# Patient Record
Sex: Female | Born: 1990 | Race: White | Hispanic: No | Marital: Single | Smoking: Former smoker
Health system: Southern US, Community
[De-identification: ages and names within clinical notes are randomized; demographics above are authoritative.]

## PROBLEM LIST (undated history)

## (undated) ENCOUNTER — Inpatient Hospital Stay: Payer: Self-pay

## (undated) ENCOUNTER — Emergency Department (HOSPITAL_COMMUNITY): Discharge status: Absent without leave | Payer: Self-pay

## (undated) DIAGNOSIS — K5909 Other constipation: Secondary | ICD-10-CM

## (undated) DIAGNOSIS — T7840XA Allergy, unspecified, initial encounter: Secondary | ICD-10-CM

## (undated) DIAGNOSIS — Z87440 Personal history of urinary (tract) infections: Secondary | ICD-10-CM

## (undated) DIAGNOSIS — F419 Anxiety disorder, unspecified: Secondary | ICD-10-CM

## (undated) DIAGNOSIS — Z9189 Other specified personal risk factors, not elsewhere classified: Secondary | ICD-10-CM

## (undated) DIAGNOSIS — R519 Headache, unspecified: Secondary | ICD-10-CM

## (undated) DIAGNOSIS — F32A Depression, unspecified: Secondary | ICD-10-CM

## (undated) DIAGNOSIS — F329 Major depressive disorder, single episode, unspecified: Secondary | ICD-10-CM

## (undated) DIAGNOSIS — R002 Palpitations: Secondary | ICD-10-CM

## (undated) DIAGNOSIS — R51 Headache: Secondary | ICD-10-CM

## (undated) HISTORY — DX: Allergy, unspecified, initial encounter: T78.40XA

## (undated) HISTORY — DX: Headache: R51

## (undated) HISTORY — DX: Major depressive disorder, single episode, unspecified: F32.9

## (undated) HISTORY — DX: Headache, unspecified: R51.9

## (undated) HISTORY — DX: Depression, unspecified: F32.A

## (undated) HISTORY — DX: Anxiety disorder, unspecified: F41.9

## (undated) HISTORY — DX: Personal history of urinary (tract) infections: Z87.440

## (undated) HISTORY — DX: Other specified personal risk factors, not elsewhere classified: Z91.89

---
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
## 2014-02-14 ENCOUNTER — Encounter: Payer: Self-pay | Admitting: Adult Health

## 2014-02-14 ENCOUNTER — Ambulatory Visit (INDEPENDENT_AMBULATORY_CARE_PROVIDER_SITE_OTHER): Payer: Managed Care, Other (non HMO) | Admitting: Adult Health

## 2014-02-14 VITALS — BP 98/68 | HR 72 | Resp 12 | Ht 66.0 in | Wt 132.0 lb

## 2014-02-14 DIAGNOSIS — Z0000 Encounter for general adult medical examination without abnormal findings: Secondary | ICD-10-CM | POA: Insufficient documentation

## 2014-02-14 MED ORDER — NORGESTIM-ETH ESTRAD TRIPHASIC 0.18/0.215/0.25 MG-35 MCG PO TABS: 1.0000 | ORAL_TABLET | Freq: Every day | ORAL | Status: DC

## 2014-02-14 NOTE — Progress Notes (Signed)
Patient ID: Karen Holden, female   DOB: Dec 13, 1990, 23 y.o.   MRN: [id number]    Subjective:    Patient ID: Karen Holden, female    DOB: [birth year], 23 y.o.   MRN: [id number]  HPI  Pt is a pleasant 23 y/o female who presents to clinic to establish care. She has not had a PCP in several years. Overall she is feeling well.     Past Medical History  Diagnosis Date  . Depression   . History of fainting   . Frequent headaches   . History of urinary tract infection      Past Surgical History  Procedure Laterality Date  . Fracture surgery  2011    left arm      Family History  Problem Relation Age of Onset  . Arthritis Mother   . Cancer Mother     Ovarian Cancer   . Alcohol abuse Father   . Stroke Maternal Grandfather   . Heart disease Maternal Grandfather   . Arthritis Maternal Grandfather   . Alcohol abuse Maternal Grandfather   . Cancer Paternal Grandfather     Colon Cancer      History   Social History  . Marital Status: Single    Spouse Name: N/A    Number of Children: N/A  . Years of Education: N/A   Occupational History  . Not on file.   Social History Main Topics  . Smoking status: Never Smoker   . Smokeless tobacco: Not on file  . Alcohol Use: No  . Drug Use: No  . Sexual Activity: Not on file   Other Topics Concern  . Not on file   Social History Narrative  . No narrative on file         Review of Systems  Constitutional: Negative.   HENT: Negative.   Eyes: Negative.   Respiratory: Negative.   Cardiovascular: Negative.   Gastrointestinal: Negative.   Endocrine: Negative.   Genitourinary: Negative.   Musculoskeletal: Negative.   Skin: Negative.   Allergic/Immunologic: Negative.   Neurological: Negative.   Hematological: Negative.   Psychiatric/Behavioral: Negative.        Objective:  Ht 5\' 6"  (1.676 m)  Wt 132 lb (59.875 kg)  BMI 21.32 kg/m2  LMP 02/02/2014   Physical Exam  Constitutional: She is oriented to  person, place, and time. She appears well-developed and well-nourished. No distress.  HENT:  Head: Normocephalic and atraumatic.  Right Ear: External ear normal.  Left Ear: External ear normal.  Nose: Nose normal.  Mouth/Throat: Oropharynx is clear and moist.  Eyes: Conjunctivae and EOM are normal. Pupils are equal, round, and reactive to light.  Neck: Normal range of motion. Neck supple. No tracheal deviation present. No thyromegaly present.  Cardiovascular: Normal rate, regular rhythm, normal heart sounds and intact distal pulses.  Exam reveals no gallop and no friction rub.   No murmur heard. Pulmonary/Chest: Effort normal and breath sounds normal. No respiratory distress. She has no wheezes. She has no rales.  Abdominal: Soft. Bowel sounds are normal. She exhibits no distension and no mass. There is no tenderness. There is no rebound and no guarding.  Musculoskeletal: Normal range of motion. She exhibits no edema and no tenderness.  Lymphadenopathy:    She has no cervical adenopathy.  Neurological: She is alert and oriented to person, place, and time. She has normal reflexes. No cranial nerve deficit. Coordination normal.  Skin: Skin is warm and dry.     Psychiatric: She  has a normal mood and affect. Her behavior is normal. Judgment and thought content normal.       Assessment & Plan:

## 2014-02-14 NOTE — Progress Notes (Signed)
Pre visit review using our clinic review tool, if applicable. No additional management support is needed unless otherwise documented below in the visit note. 

## 2014-02-14 NOTE — Patient Instructions (Signed)
   Thank you for choosing [place] at [facility] for your health care needs.  Please have your labs drawn prior to leaving the office.  The results will be available through MyChart for your convenience. Please remember to activate this. The activation code is located at the end of this form.  Return at your earliest convenience for your fasting labs. You can drink water but nothing else after midnight the night before.

## 2014-02-21 ENCOUNTER — Other Ambulatory Visit (INDEPENDENT_AMBULATORY_CARE_PROVIDER_SITE_OTHER): Payer: Managed Care, Other (non HMO)

## 2014-02-21 DIAGNOSIS — Z0000 Encounter for general adult medical examination without abnormal findings: Secondary | ICD-10-CM

## 2014-02-21 LAB — CBC WITH DIFFERENTIAL/PLATELET
BASOS ABS: 0 10*3/uL (ref 0.0–0.1)
BASOS PCT: 0.5 % (ref 0.0–3.0)
Eosinophils Absolute: 0.1 10*3/uL (ref 0.0–0.7)
Eosinophils Relative: 2.9 % (ref 0.0–5.0)
HEMATOCRIT: 37.8 % (ref 36.0–46.0)
HEMOGLOBIN: 12.9 g/dL (ref 12.0–15.0)
LYMPHS ABS: 2 10*3/uL (ref 0.7–4.0)
Lymphocytes Relative: 44.3 % (ref 12.0–46.0)
MCHC: 34.1 g/dL (ref 30.0–36.0)
MCV: 87.6 fl (ref 78.0–100.0)
MONOS PCT: 11.2 % (ref 3.0–12.0)
Monocytes Absolute: 0.5 10*3/uL (ref 0.1–1.0)
NEUTROS ABS: 1.9 10*3/uL (ref 1.4–7.7)
Neutrophils Relative %: 41.1 % — ABNORMAL LOW (ref 43.0–77.0)
Platelets: 283 10*3/uL (ref 150.0–400.0)
RBC: 4.32 Mil/uL (ref 3.87–5.11)
RDW: 12.2 % (ref 11.5–14.6)
WBC: 4.6 10*3/uL (ref 4.5–10.5)

## 2014-02-21 LAB — COMPREHENSIVE METABOLIC PANEL
ALT: 17 U/L (ref 0–35)
AST: 19 U/L (ref 0–37)
Albumin: 4.4 g/dL (ref 3.5–5.2)
Alkaline Phosphatase: 30 U/L — ABNORMAL LOW (ref 39–117)
BILIRUBIN TOTAL: 0.8 mg/dL (ref 0.3–1.2)
BUN: 7 mg/dL (ref 6–23)
CO2: 26 mEq/L (ref 19–32)
Calcium: 9.4 mg/dL (ref 8.4–10.5)
Chloride: 104 mEq/L (ref 96–112)
Creatinine, Ser: 0.7 mg/dL (ref 0.4–1.2)
GFR: 122.79 mL/min (ref >60.00)
GLUCOSE: 88 mg/dL (ref 70–99)
POTASSIUM: 3.8 meq/L (ref 3.5–5.1)
Sodium: 137 mEq/L (ref 135–145)
Total Protein: 7.2 g/dL (ref 6.0–8.3)

## 2014-02-21 LAB — LIPID PANEL
CHOLESTEROL: 206 mg/dL — AB (ref 0–200)
HDL: 69.1 mg/dL (ref >39.00)
LDL Cholesterol: 122 mg/dL — ABNORMAL HIGH (ref 0–99)
Total CHOL/HDL Ratio: 3
Triglycerides: 75 mg/dL (ref 0.0–149.0)
VLDL: 15 mg/dL (ref 0.0–40.0)

## 2014-02-21 LAB — TSH: TSH: 0.57 u[IU]/mL (ref 0.35–5.50)

## 2014-02-22 ENCOUNTER — Encounter: Payer: Self-pay | Admitting: *Deleted

## 2014-02-22 LAB — VITAMIN D 25 HYDROXY (VIT D DEFICIENCY, FRACTURES): VIT D 25 HYDROXY: 32 ng/mL (ref 30–89)

## 2014-02-28 ENCOUNTER — Encounter: Payer: Self-pay | Admitting: Adult Health

## 2014-03-02 NOTE — Telephone Encounter (Signed)
Unread mychart message mailed  

## 2014-03-24 ENCOUNTER — Ambulatory Visit (INDEPENDENT_AMBULATORY_CARE_PROVIDER_SITE_OTHER): Payer: Managed Care, Other (non HMO) | Admitting: Adult Health

## 2014-03-24 ENCOUNTER — Encounter: Payer: Self-pay | Admitting: Adult Health

## 2014-03-24 VITALS — BP 100/58 | HR 65 | Temp 98.6°F | Resp 14 | Wt 127.0 lb

## 2014-03-24 DIAGNOSIS — R109 Unspecified abdominal pain: Secondary | ICD-10-CM

## 2014-03-24 LAB — POCT URINALYSIS DIPSTICK
BILIRUBIN UA: NEGATIVE
GLUCOSE UA: NEGATIVE
KETONES UA: NEGATIVE
Leukocytes, UA: NEGATIVE
NITRITE UA: NEGATIVE
Protein, UA: NEGATIVE
RBC UA: NEGATIVE
SPEC GRAV UA: 1.02
Urobilinogen, UA: 0.2
pH, UA: 7

## 2014-03-24 NOTE — Progress Notes (Signed)
Patient ID: Karen Holden Karen Holden, female   DOB: 1991/02/22, 23 y.o.   MRN: [id number]   Subjective:    Patient ID: Karen Holden Karen Holden, female    DOB: 10-12-1991, 23 y.o.   MRN: [id number]  HPI  Karen Holden Holden is a pleasant 23 year old female who presents to clinic with right flank pain that radiated around to the front. The pain began on Tuesday. She reports the pain subsided on Wednesday. She is concerned because she has had kidney infections in the past. She is not having any dysuria. Started menstrual cycle on Wednesday. No fever, chills.   Past Medical History  Diagnosis Date  . Depression     Dealing with the death of her best friend  . History of fainting   . Frequent headaches   . History of urinary tract infection     Current Outpatient Prescriptions on File Prior to Visit  Medication Sig Dispense Refill  . Norgestimate-Ethinyl Estradiol Triphasic (ORTHO TRI-CYCLEN, 28,) 0.18/0.215/0.25 MG-35 MCG tablet Take 1 tablet by mouth daily.  1 Package  11   No current facility-administered medications on file prior to visit.     Review of Systems  Constitutional: Negative for fever and chills.  Genitourinary: Positive for flank pain (radiating around to the front). Negative for dysuria, frequency and hematuria (menstruating).  All other systems reviewed and are negative.      Objective:  BP 100/58  Pulse 65  Temp(Src) 98.6 F (37 C) (Oral)  Resp 14  Wt 127 lb (57.607 kg)  SpO2 98%  LMP 03/22/2014   Physical Exam  Constitutional: She is oriented to person, place, and time. No distress.  Cardiovascular: Normal rate and regular rhythm.   Pulmonary/Chest: Effort normal. No respiratory distress.  Abdominal: Soft. She exhibits no distension and no mass. There is no tenderness. There is no rebound and no guarding.  Genitourinary:  Exam is normal. Patient is not exhibiting any costovertebral angle tenderness with palpation. No suprapubic tenderness.  Musculoskeletal: Normal range of  motion.  Neurological: She is alert and oriented to person, place, and time.  Skin: Skin is warm and dry.  Psychiatric: She has a normal mood and affect. Her behavior is normal. Judgment and thought content normal.      Assessment & Plan:   1. Flank pain UA negative for UTI. Her symptoms are currently relieved. She is to return to clinic if symptoms return.  - POCT urinalysis dipstick

## 2014-03-24 NOTE — Progress Notes (Signed)
Pre visit review using our clinic review tool, if applicable. No additional management support is needed unless otherwise documented below in the visit note. 

## 2014-04-26 ENCOUNTER — Encounter: Payer: Self-pay | Admitting: Adult Health

## 2014-04-26 ENCOUNTER — Ambulatory Visit: Payer: Self-pay | Admitting: Adult Health

## 2014-04-26 ENCOUNTER — Ambulatory Visit (INDEPENDENT_AMBULATORY_CARE_PROVIDER_SITE_OTHER): Payer: Managed Care, Other (non HMO) | Admitting: Adult Health

## 2014-04-26 VITALS — BP 104/60 | HR 70 | Temp 98.1°F | Resp 12 | Ht 66.0 in | Wt 124.5 lb

## 2014-04-26 DIAGNOSIS — R1011 Right upper quadrant pain: Secondary | ICD-10-CM

## 2014-04-26 DIAGNOSIS — Z139 Encounter for screening, unspecified: Secondary | ICD-10-CM

## 2014-04-26 LAB — POCT URINE PREGNANCY: Preg Test, Ur: NEGATIVE

## 2014-04-26 NOTE — Progress Notes (Signed)
Patient ID: Karen Holden, female   DOB: Apr 30, 1991, 23 y.o.   MRN: [id number]    Subjective:    Patient ID: Karen Holden, female    DOB: [birth year], 23 y.o.   MRN: [id number]  HPI  Pt is a pleasant 23 y/o female who presents to clinic with RUQ pain with nausea and episodes of vomiting. Denies fever, chills, hematuria. LMP 04/17/14. She is on BCP. She was seen in clinic in April with right flank pain that radiated anteriorly. She denies any flank pain today. Reports hx of "kidney infection". Denies hx of nephrolithiasis. Pt reports that her pain has never really resolved since I last saw her in April. She also reports that she "feels a lump on that side".   Past Medical History  Diagnosis Date  . Depression     Dealing with the death of her best friend  . History of fainting   . Frequent headaches   . History of urinary tract infection     Current Outpatient Prescriptions on File Prior to Visit  Medication Sig Dispense Refill  . Norgestimate-Ethinyl Estradiol Triphasic (ORTHO TRI-CYCLEN, 28,) 0.18/0.215/0.25 MG-35 MCG tablet Take 1 tablet by mouth daily.  1 Package  11   No current facility-administered medications on file prior to visit.     Review of Systems  Constitutional: Negative.   HENT: Negative.   Eyes: Negative.   Respiratory: Negative.   Cardiovascular: Negative.   Gastrointestinal: Positive for nausea, vomiting and abdominal pain. Negative for diarrhea and constipation.  Endocrine: Negative.   Genitourinary: Negative.  Negative for dysuria, frequency, hematuria, flank pain, difficulty urinating and menstrual problem.  Musculoskeletal: Negative.   Skin: Negative.   Allergic/Immunologic: Negative.   Neurological: Negative.   Hematological: Negative.   Psychiatric/Behavioral: Negative.        Objective:  BP 104/60  Pulse 70  Temp(Src) 98.1 F (36.7 C) (Oral)  Resp 12  Wt 124 lb 8 oz (56.473 kg)  SpO2 97%  LMP 04/19/2014   Physical Exam    Constitutional: She is oriented to person, place, and time. No distress.  HENT:  Head: Normocephalic and atraumatic.  Eyes: Conjunctivae and EOM are normal.  Neck: Normal range of motion. Neck supple.  Cardiovascular: Normal rate and regular rhythm.   Pulmonary/Chest: Effort normal. No respiratory distress.  Abdominal: Soft. Bowel sounds are normal. She exhibits no distension and no mass. There is no tenderness. There is no rebound and no guarding.  No palpable masses. No tenderness with deep palpation  Musculoskeletal: Normal range of motion.  Neurological: She is alert and oriented to person, place, and time. She has normal reflexes. Coordination normal.  Skin: Skin is warm and dry.  Psychiatric: She has a normal mood and affect. Her behavior is normal. Judgment and thought content normal.      Assessment & Plan:   1. RUQ abdominal pain Pt presents with pain since April. Last seen in office at that time which she was also c/o R flank pain. None today. Also having n/v. Denies hematuria.  - CT Abdomen Pelvis W Contrast; Future  2. Screening Pregnancy screen prior to CT scan. LMP 04/17/14. Pt on BCP - B-HCG Quant

## 2014-04-26 NOTE — Progress Notes (Signed)
Pre visit review using our clinic review tool, if applicable. No additional management support is needed unless otherwise documented below in the visit note. 

## 2014-04-28 ENCOUNTER — Encounter: Payer: Self-pay | Admitting: Adult Health

## 2014-06-05 ENCOUNTER — Encounter: Payer: Self-pay | Admitting: Adult Health

## 2014-08-31 ENCOUNTER — Ambulatory Visit (INDEPENDENT_AMBULATORY_CARE_PROVIDER_SITE_OTHER): Payer: Managed Care, Other (non HMO) | Admitting: Internal Medicine

## 2014-08-31 ENCOUNTER — Encounter: Payer: Self-pay | Admitting: Internal Medicine

## 2014-08-31 VITALS — BP 102/68 | HR 72 | Temp 98.5°F | Wt 127.0 lb

## 2014-08-31 DIAGNOSIS — Z889 Allergy status to unspecified drugs, medicaments and biological substances status: Secondary | ICD-10-CM

## 2014-08-31 DIAGNOSIS — Z9109 Other allergy status, other than to drugs and biological substances: Secondary | ICD-10-CM

## 2014-08-31 NOTE — Progress Notes (Signed)
Pre visit review using our clinic review tool, if applicable. No additional management support is needed unless otherwise documented below in the visit note. 

## 2014-08-31 NOTE — Patient Instructions (Signed)

## 2014-08-31 NOTE — Progress Notes (Signed)
HPI  Pt presents to the clinic today with c/o sore throat, dizziness, chills and fatigue. She reports this started 4-5 days ago. She denies fever, cough or body aches.She has tried Ibuprofen and Dayquil with little relief. She has no history of allergies or breathing problems. She has had sick contacts (recently babysitted her niece with pneumonia).  Review of Systems      Past Medical History  Diagnosis Date  . Depression     Dealing with the death of her best friend  . History of fainting   . Frequent headaches   . History of urinary tract infection     Family History  Problem Relation Age of Onset  . Arthritis Mother   . Cancer Mother      Cervical Cancer   . Alcohol abuse Father   . Stroke Maternal Grandfather   . Heart disease Maternal Grandfather   . Arthritis Maternal Grandfather   . Alcohol abuse Maternal Grandfather   . Cancer Paternal Grandfather     Colon Cancer     History   Social History  . Marital Status: Single    Spouse Name: N/A    Number of Children: N/A  . 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 Smoking status: Never Smoker   . Smokeless tobacco: Not on file  . Alcohol Use: No  . Drug Use: No  . Sexual Activity: Not on file   Other Topics Concern  . Not on file   Social History Narrative   Karen Holden grew up in [place], [place]. She lives at home with her mom. Karen Holden works in Scientist, research (medical) at [employer]. She enjoys spending time with her friends.    Allergies  Allergen Reactions  . Imitrex [Sumatriptan] Other (See Comments)    Chest pain     Constitutional: Positive fatigue. Denies fever, headache or abrupt weight changes.  HEENT:  Positive sore throat. Denies eye redness, eye pain, pressure behind the eyes, facial pain, nasal congestion, ear pain, ringing in the ears, wax buildup, runny nose or bloody nose. Respiratory: Denies cough or difficulty breathing or shortness of  breath.  Cardiovascular: Denies chest pain, chest tightness, palpitations or swelling in the hands or feet.   No other specific complaints in a complete review of systems (except as listed in HPI above).  Objective:   BP 102/68  Pulse 72  Temp(Src) 98.5 F (36.9 C) (Oral)  Wt 127 lb (57.607 kg)  SpO2 98%  LMP 08/28/2014  Wt Readings from Last 3 Encounters:  08/31/14 127 lb (57.607 kg)  04/26/14 124 lb 8 oz (56.473 kg)  03/24/14 127 lb (57.607 kg)     General: Appears her stated age, well developed, well nourished in NAD. HEENT: ars: Tm's gray and intact, normal light reflex; Nose: mucosa pink and moist, septum midline; Throat/Mouth: + PND. Teeth present, mucosa erythematous and moist, no exudate noted, no lesions or ulcerations noted.  Cardiovascular: Normal rate and rhythm. S1,S2 noted.  No murmur, rubs or gallops noted. No JVD or BLE edema. No carotid bruits noted. Pulmonary/Chest: Normal effort and positive vesicular breath sounds. No respiratory distress. No wheezes, rales or ronchi noted.      Assessment & Plan:   Sore throat secondary to allergies:  Get some rest and drink plenty of water Do salt water gargles for the sore throat Ibuprofen Start zyrtec OTC  RTC as needed or  if symptoms persist.

## 2014-08-31 NOTE — Progress Notes (Signed)
Subjective:    Patient ID: Karen Holden, female    DOB: [birth year], 23 y.o.   MRN: [id number]  HPI  Patient presetnt with sore throat dizzyness fatgue and chills for the past 4 days. She has taken ibuprofen and dayquil with little relief.  She reports that she was around  her niece this past weekend who was Dx with Pneumonia.  She reports that her niece had different symptoms.  She also reports a strong history of seasonal allergies. .Review of Systems   Past Medical History  Diagnosis Date  . Depression     Dealing with the death of her best friend  . History of fainting   . Frequent headaches   . History of urinary tract infection     Current Outpatient Prescriptions  Medication Sig Dispense Refill  . Norgestimate-Ethinyl Estradiol Triphasic (ORTHO TRI-CYCLEN, 28,) 0.18/0.215/0.25 MG-35 MCG tablet Take 1 tablet by mouth daily.  1 Package  11   No current facility-administered medications for this visit.    Allergies  Allergen Reactions  . Imitrex [Sumatriptan] Other (See Comments)    Chest pain    Family History  Problem Relation Age of Onset  . Arthritis Mother   . Cancer Mother      Cervical Cancer   . Alcohol abuse Father   . Stroke Maternal Grandfather   . Heart disease Maternal Grandfather   . Arthritis Maternal Grandfather   . Alcohol abuse Maternal Grandfather   . Cancer Paternal Grandfather     Colon Cancer     History   Social History  . Marital Status: Single    Spouse Name: N/A    Number of Children: N/A  . 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 Smoking status: Never Smoker   . Smokeless tobacco: Not on file  . Alcohol Use: No  . Drug Use: No  . Sexual Activity: Not on file   Other Topics Concern  . Not on file   Social History Narrative   Karen Holden grew up in [place], [place]. She lives at home with her mom. Marketa works in Scientist, research (medical) at [employer]. She enjoys  spending time with her friends.     Constitutional: Denies fever, malaise,or abrupt weight changes.  HEENT: Denies eye pain, eye redness, ear pain, ringing in the ears, wax buildup, runny nose, nasal congestion, bloody nose. Respiratory: Denies difficulty breathing, shortness of breath, cough or sputum production.   Cardiovascular: Denies chest pain, chest tightness, palpitations or swelling in the hands or feet.   No other specific complaints in a complete review of systems (except as listed in HPI above).     Objective:   Physical Exam  BP 102/68  Pulse 72  Temp(Src) 98.5 F (36.9 C) (Oral)  Wt 127 lb (57.607 kg)  SpO2 98%  LMP 08/28/2014 Wt Readings from Last 3 Encounters:  08/31/14 127 lb (57.607 kg)  04/26/14 124 lb 8 oz (56.473 kg)  03/24/14 127 lb (57.607 kg)    General: Appears their stated age, well developed, well nourished in NAD. HEENT: Ears: Tm's gray and intact, normal light reflex; Nose: mucosa pink and moist, septum midline; Throat/Mouth: pharynx erythematous. Neck: . Neck supple, trachea midline. No lymphadenopathy noted.  Cardiovascular: Normal rate and rhythm. S1,S2 noted.  No murmur, rubs or gallops noted.Pulmonary/Chest: Normal effort and positive vesicular breath sounds. No respiratory distress. No wheezes, rales or ronchi  noted.   BMET    Component Value Date/Time   NA 137 02/21/2014 1016   K 3.8 02/21/2014 1016   CL 104 02/21/2014 1016   CO2 26 02/21/2014 1016   GLUCOSE 88 02/21/2014 1016   BUN 7 02/21/2014 1016   CREATININE 0.7 02/21/2014 1016   CALCIUM 9.4 02/21/2014 1016   GFRNONAA NOT CALCULATED 07/31/2010 2030   GFRAA  Value: NOT CALCULATED        The eGFR has been calculated using the MDRD equation. This calculation has not been validated in all clinical situations. eGFR's persistently <60 mL/min signify possible Chronic Kidney Disease. 07/31/2010 2030    Lipid Panel     Component Value Date/Time   CHOL 206* 02/21/2014 1016   TRIG 75.0 02/21/2014  1016   HDL 69.10 02/21/2014 1016   CHOLHDL 3 02/21/2014 1016   VLDL 15.0 02/21/2014 1016   LDLCALC 122* 02/21/2014 1016    CBC    Component Value Date/Time   WBC 4.6 02/21/2014 1016   RBC 4.32 02/21/2014 1016   HGB 12.9 02/21/2014 1016   HCT 37.8 02/21/2014 1016   PLT 283.0 02/21/2014 1016   MCV 87.6 02/21/2014 1016   MCH 29.8 07/31/2010 2030   MCHC 34.1 02/21/2014 1016   RDW 12.2 02/21/2014 1016   LYMPHSABS 2.0 02/21/2014 1016   MONOABS 0.5 02/21/2014 1016   EOSABS 0.1 02/21/2014 1016   BASOSABS 0.0 02/21/2014 1016    Hgb A1C No results found for this basename: HGBA1C         Assessment & Plan:  Sore throat Seasonal allergies  Take Zyrtec daily Drink plenty of fluids and get plenty of rest Call our office if your symptoms worsen or do not improve

## 2015-02-26 ENCOUNTER — Encounter: Payer: Managed Care, Other (non HMO) | Admitting: Nurse Practitioner

## 2015-03-08 ENCOUNTER — Ambulatory Visit (INDEPENDENT_AMBULATORY_CARE_PROVIDER_SITE_OTHER): Payer: Managed Care, Other (non HMO) | Admitting: Nurse Practitioner

## 2015-03-08 ENCOUNTER — Encounter: Payer: Self-pay | Admitting: Nurse Practitioner

## 2015-03-08 VITALS — BP 105/71 | HR 80 | Temp 98.5°F | Ht 66.0 in | Wt 117.1 lb

## 2015-03-08 DIAGNOSIS — F329 Major depressive disorder, single episode, unspecified: Secondary | ICD-10-CM | POA: Diagnosis not present

## 2015-03-08 DIAGNOSIS — R1011 Right upper quadrant pain: Secondary | ICD-10-CM | POA: Diagnosis not present

## 2015-03-08 DIAGNOSIS — N920 Excessive and frequent menstruation with regular cycle: Secondary | ICD-10-CM

## 2015-03-08 DIAGNOSIS — F32A Depression, unspecified: Secondary | ICD-10-CM

## 2015-03-08 MED ORDER — ESCITALOPRAM OXALATE 10 MG PO TABS: 10.0000 mg | ORAL_TABLET | Freq: Every day | ORAL | Status: DC
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
## 2015-03-08 NOTE — Progress Notes (Signed)
Pre visit review using our clinic review tool, if applicable. No additional management support is needed unless otherwise documented below in the visit note. 

## 2015-03-08 NOTE — Patient Instructions (Signed)
Take the Lexapro daily.   We will contact you about your referrals (our coordinator will).

## 2015-03-09 ENCOUNTER — Encounter: Payer: Self-pay | Admitting: Adult Health

## 2015-03-12 DIAGNOSIS — N920 Excessive and frequent menstruation with regular cycle: Secondary | ICD-10-CM | POA: Insufficient documentation

## 2015-03-12 DIAGNOSIS — R1011 Right upper quadrant pain: Secondary | ICD-10-CM | POA: Insufficient documentation

## 2015-03-12 DIAGNOSIS — F329 Major depressive disorder, single episode, unspecified: Secondary | ICD-10-CM | POA: Insufficient documentation

## 2015-03-12 DIAGNOSIS — F419 Anxiety disorder, unspecified: Secondary | ICD-10-CM | POA: Insufficient documentation

## 2015-03-12 DIAGNOSIS — F32A Depression, unspecified: Secondary | ICD-10-CM | POA: Insufficient documentation

## 2015-03-12 NOTE — Assessment & Plan Note (Signed)
Will try back on Lexapro 10 mg and fu in 6 weeks.

## 2015-03-12 NOTE — Assessment & Plan Note (Signed)
Referral to GI  

## 2015-03-12 NOTE — Assessment & Plan Note (Signed)
Discussed options with pt. Will send to GYN for menstrual needs.

## 2015-05-02 ENCOUNTER — Other Ambulatory Visit: Payer: Self-pay | Admitting: Gastroenterology

## 2015-05-02 DIAGNOSIS — R1011 Right upper quadrant pain: Secondary | ICD-10-CM

## 2015-05-09 ENCOUNTER — Ambulatory Visit: Payer: Managed Care, Other (non HMO)

## 2015-06-20 ENCOUNTER — Ambulatory Visit (INDEPENDENT_AMBULATORY_CARE_PROVIDER_SITE_OTHER): Payer: Managed Care, Other (non HMO) | Admitting: Nurse Practitioner

## 2015-06-20 ENCOUNTER — Encounter: Payer: Self-pay | Admitting: Nurse Practitioner

## 2015-06-20 VITALS — BP 104/70 | HR 71 | Temp 98.0°F | Resp 14 | Ht 66.0 in | Wt 120.4 lb

## 2015-06-20 DIAGNOSIS — N6310 Unspecified lump in the right breast, unspecified quadrant: Secondary | ICD-10-CM

## 2015-06-20 DIAGNOSIS — R1011 Right upper quadrant pain: Secondary | ICD-10-CM | POA: Diagnosis not present

## 2015-06-20 DIAGNOSIS — N63 Unspecified lump in breast: Secondary | ICD-10-CM

## 2015-06-20 LAB — POCT URINE PREGNANCY: PREG TEST UR: NEGATIVE

## 2015-06-20 NOTE — Patient Instructions (Signed)
We will contact you to schedule your ultrasound.

## 2015-06-20 NOTE — Assessment & Plan Note (Signed)
Irregular cycle- will obtain POCT urine pregnancy (result negative). Pt would like to have imaging to ease her mind. I discussed this could be because she should be starting her cycle soon. Probable cyst. Will follow.

## 2015-06-20 NOTE — Progress Notes (Signed)
   Subjective:    Patient ID: Karen Holden, female    DOB: [birth year], 24 y.o.   MRN: [id number]  HPI  Karen Holden is a 24 yo female with a CC of lump of right breast.   1) Lump found on Sunday in the shower, tender with pressing hard, and movable per pt. She told her mother who then instructed her to be checked out.   LMP- 2 last month, will be starting in the next week or so  [place]- breast cancer  Paternal Aunt- Cysts   Review of Systems  Constitutional: Negative for fever, chills, diaphoresis and fatigue.  Respiratory: Negative for chest tightness, shortness of breath and wheezing.   Cardiovascular: Negative for chest pain, palpitations and leg swelling.  Gastrointestinal: Negative for nausea, vomiting and diarrhea.  Skin: Negative for rash.  Neurological: Negative for dizziness, weakness, numbness and headaches.  Psychiatric/Behavioral: The patient is not nervous/anxious.       Objective:   Physical Exam  Constitutional: She is oriented to person, place, and time. She appears well-developed and well-nourished. No distress.  BP 104/70 mmHg  Pulse 71  Temp(Src) 98 F (36.7 C)  Resp 14  Ht 5\' 6"  (1.676 m)  Wt 120 lb 6.4 oz (54.613 kg)  BMI 19.44 kg/m2  SpO2 98%   HENT:  Head: Normocephalic and atraumatic.  Right Ear: External ear normal.  Left Ear: External ear normal.  Cardiovascular: Intact distal pulses.   Pulmonary/Chest: Effort normal and breath sounds normal. No respiratory distress. She has no wheezes. She has no rales. She exhibits no tenderness.    Neurological: She is alert and oriented to person, place, and time. No cranial nerve deficit. She exhibits normal muscle tone. Coordination normal.  Skin: Skin is warm and dry. No rash noted. She is not diaphoretic.  Psychiatric: She has a normal mood and affect. Her behavior is normal. Judgment and thought content normal.          Assessment & Plan:

## 2015-06-20 NOTE — Progress Notes (Signed)
Pre visit review using our clinic review tool, if applicable. No additional management support is needed unless otherwise documented below in the visit note. 

## 2015-07-05 ENCOUNTER — Ambulatory Visit: Payer: Managed Care, Other (non HMO) | Attending: Nurse Practitioner

## 2015-07-20 ENCOUNTER — Encounter: Payer: Self-pay | Admitting: Nurse Practitioner

## 2015-07-20 ENCOUNTER — Ambulatory Visit (INDEPENDENT_AMBULATORY_CARE_PROVIDER_SITE_OTHER): Payer: Managed Care, Other (non HMO) | Admitting: Nurse Practitioner

## 2015-07-20 DIAGNOSIS — R300 Dysuria: Secondary | ICD-10-CM | POA: Diagnosis not present

## 2015-07-20 DIAGNOSIS — N926 Irregular menstruation, unspecified: Secondary | ICD-10-CM | POA: Diagnosis not present

## 2015-07-20 LAB — POCT URINALYSIS DIPSTICK
Bilirubin, UA: NEGATIVE
Glucose, UA: NEGATIVE
KETONES UA: NEGATIVE
Nitrite, UA: NEGATIVE
PH UA: 6.5
PROTEIN UA: NEGATIVE
RBC UA: NEGATIVE
SPEC GRAV UA: 1.025
Urobilinogen, UA: 0.2

## 2015-07-20 LAB — POCT URINE PREGNANCY: PREG TEST UR: POSITIVE — AB

## 2015-07-20 NOTE — Assessment & Plan Note (Signed)
POCT urine looks to be normal, will culture. Asked pt to watch cranberry juice due to high sugar content. Azo is category B. Asked pt to take this and drink water. Will obtain a culture.

## 2015-07-20 NOTE — Assessment & Plan Note (Signed)
POCT urine pregnancy pos as well as home pregnancy tests positive. Will send to OB.GYN pt has 3 choices on [facility] and will let me know for referral (if needed).

## 2015-07-20 NOTE — Progress Notes (Signed)
Patient ID: Karen Holden, female    DOB: May 31, 1991  Age: 24 y.o. MRN: [id number]  CC: Urinary Tract Infection   HPI Karen Holden presents for possible pregnancy/UTI.  1) Home tests positive, POCT positive in office.  2) UTI- dysuria x 2 weeks, frequency and urgency  Pt taking cranberry tablets and cranberry juice  History Karen Holden has a past medical history of Depression; History of fainting; Frequent headaches; and History of urinary tract infection.   She has past surgical history that includes Fracture surgery (2011).   Her family history includes Alcohol abuse in her father and maternal grandfather; Arthritis in her maternal grandfather and mother; Cancer in her mother and paternal grandfather; Heart disease in her maternal grandfather; Stroke in her maternal grandfather.She reports that she has never smoked. She does not have any smokeless tobacco history on file. She reports that she does not drink alcohol or use illicit drugs.  Outpatient Prescriptions Prior to Visit  Medication Sig Dispense Refill  . escitalopram (LEXAPRO) 10 MG tablet Take 1 tablet (10 mg total) by mouth daily. (Patient not taking: Reported on 07/20/2015) 30 tablet 1  . Norgestimate-Ethinyl Estradiol Triphasic (ORTHO TRI-CYCLEN, 28,) 0.18/0.215/0.25 MG-35 MCG tablet Take 1 tablet by mouth daily. (Patient not taking: Reported on 07/20/2015) 1 Package 11   No facility-administered medications prior to visit.    ROS Review of Systems  Constitutional: Negative for fever, chills, diaphoresis and fatigue.  Genitourinary: Positive for dysuria, urgency, frequency and menstrual problem. Negative for flank pain and difficulty urinating.       Missed cycle  Neurological: Negative for dizziness, weakness, numbness and headaches.    Objective:  BP 110/72 mmHg  Pulse 94  Temp(Src) 97.9 F (36.6 C)  Resp 14  Ht 5\' 6"  (1.676 m)  Wt 120 lb 12.8 oz (54.795 kg)  BMI 19.51 kg/m2  SpO2 96%  Physical Exam   Constitutional: She is oriented to person, place, and time. She appears well-developed and well-nourished. No distress.  HENT:  Head: Normocephalic and atraumatic.  Right Ear: External ear normal.  Left Ear: External ear normal.  Abdominal: There is no CVA tenderness.  Neurological: She is alert and oriented to person, place, and time. No cranial nerve deficit. She exhibits normal muscle tone. Coordination normal.  Skin: Skin is warm and dry. No rash noted. She is not diaphoretic.  Psychiatric: She has a normal mood and affect. Her behavior is normal. Judgment and thought content normal.    Assessment & Plan:   Karen Holden was seen today for urinary tract infection.  Diagnoses and all orders for this visit:  Dysuria -     POCT urinalysis dipstick -     Urine culture  Missed period -     POCT urine pregnancy   I am having Karen Holden maintain her Norgestimate-Ethinyl Estradiol Triphasic and escitalopram.  No orders of the defined types were placed in this encounter.     Follow-up: Return if symptoms worsen or fail to improve.

## 2015-07-20 NOTE — Patient Instructions (Signed)
Please get set up with an OB ([facility], [facility], or [facility]).   Congrats!   Drink lots of water- watch the cranberry juice.

## 2015-07-20 NOTE — Progress Notes (Signed)
Pre visit review using our clinic review tool, if applicable. No additional management support is needed unless otherwise documented below in the visit note. 

## 2015-12-09 NOTE — L&D Delivery Note (Addendum)
Delivery Note At 7:39 PM a viable female was delivered via Vaginal, Spontaneous Delivery (Presentation: OA; ROA ).  APGAR: 9, 9; weight 3210.   Placenta status: Intact, Spontaneous.  Cord: 3 vessels with the following complications: none.  Cord pH: NA  In room due to fetal decel a few minutes earlier. Pt checked and found to have a lip of cervix. AROM for scant light meconium stained fluid and pushed with pt to reduce lip. Mom pushed very well to delivery viable female infant. The head followed by shoulders, which delivered without difficulty, and the rest of the body.  No nuchal cord noted.  Baby to mom's chest.  Short cord clamped and cut after 3 min delay.  No cord blood obtained.  Placenta delivered spontaneously, intact, with a 3-vessel cord.  Tiny perineal split hemostatic no repair.  All counts correct.  Brisk bleeding after placenta- hemostasis obtained with IV pitocin, 800 mcg cytotec and fundal massage. EBL 400 mL.    Anesthesia: Epidural  Episiotomy: None Lacerations:  Tiny split at perineum no repair Suture Repair: NA Est. Blood Loss (mL):  400  Mom to postpartum.  Baby to Couplet care / Skin to Skin.  Rod Can, CNM

## 2016-01-03 LAB — OB RESULTS CONSOLE RPR: RPR: NONREACTIVE

## 2016-01-03 LAB — OB RESULTS CONSOLE GC/CHLAMYDIA
CHLAMYDIA, DNA PROBE: NEGATIVE
Gonorrhea: NEGATIVE

## 2016-01-03 LAB — OB RESULTS CONSOLE HEPATITIS B SURFACE ANTIGEN: HEP B S AG: NEGATIVE

## 2016-01-03 LAB — OB RESULTS CONSOLE HGB/HCT, BLOOD: HEMOGLOBIN: 3.6 g/dL

## 2016-01-03 LAB — OB RESULTS CONSOLE VARICELLA ZOSTER ANTIBODY, IGG: Varicella: IMMUNE

## 2016-01-03 LAB — OB RESULTS CONSOLE RUBELLA ANTIBODY, IGM: Rubella: IMMUNE

## 2016-02-18 LAB — OB RESULTS CONSOLE GBS: STREP GROUP B AG: NEGATIVE

## 2016-02-22 IMAGING — CT CT ABD-PELV W/ CM
2 of 4 series · 17 of 46 positions shown, 19 images · IV contrast (agent unspecified)
Comparison: None.

CLINICAL DATA: Right-sided abdominal pain. Worsening over 1 month.
Nausea. No history of surgery or malignancy.

EXAM:
CT ABDOMEN AND PELVIS WITH CONTRAST
TECHNIQUE: Multidetector CT imaging of the abdomen and pelvis was performed
using the standard protocol following bolus administration of
intravenous contrast.
CONTRAST:  100 mL [id number]

[Series 2: routine abd pel with · axial · 0.66mm/px · z∈[-855,-475]mm · 14 of 84 slices shown, 16 images]
[im 4/84  soft-tissue]
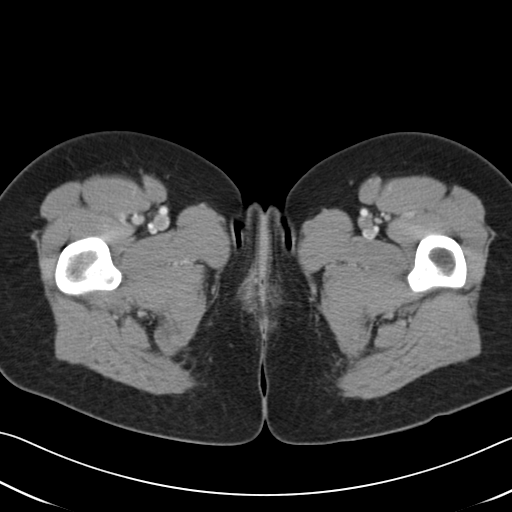
[im 4/84  bone]
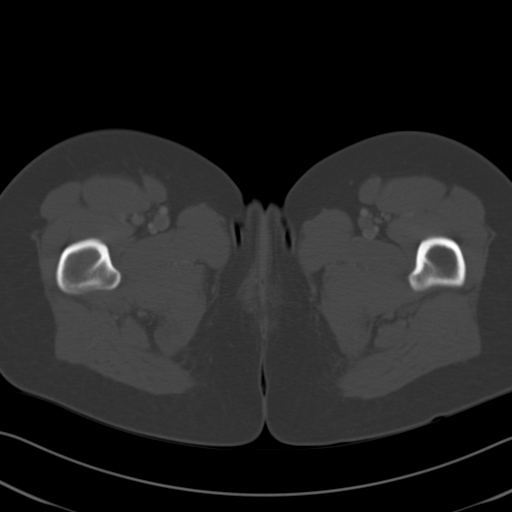
[im 10/84  soft-tissue]
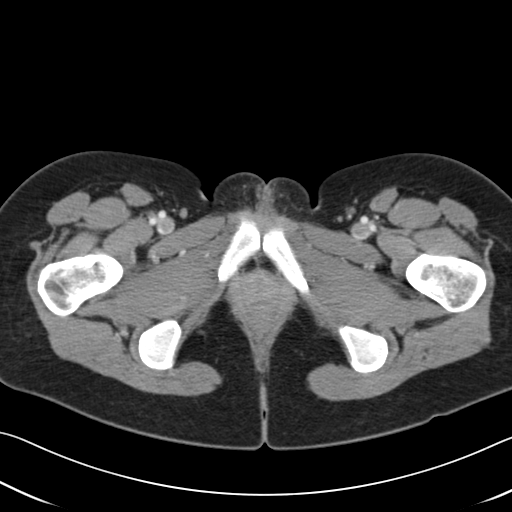
[im 17/84  soft-tissue]
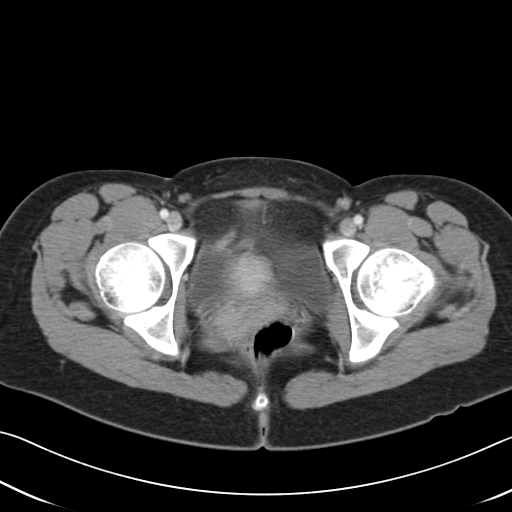
[im 24/84  soft-tissue]
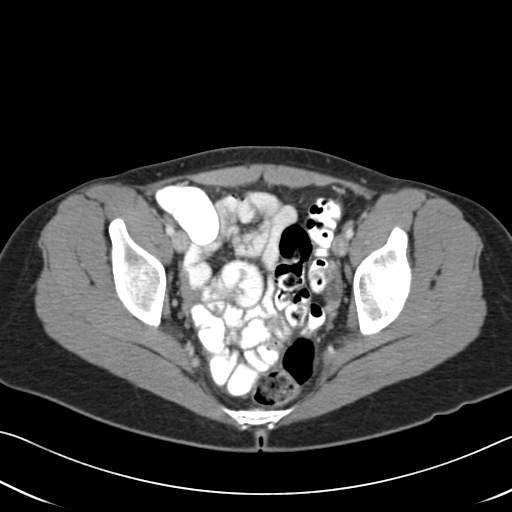
[im 27/84  soft-tissue]
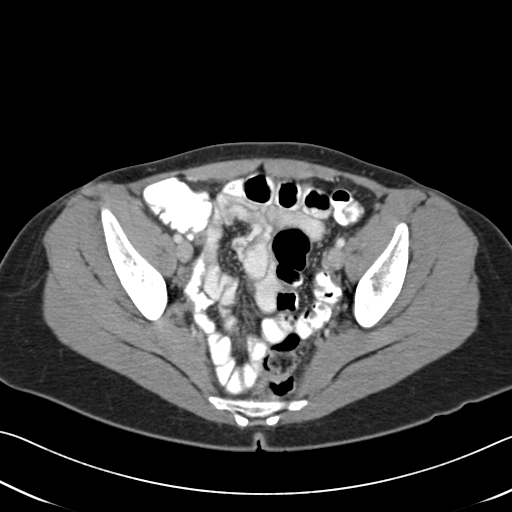
[im 34/84  soft-tissue]
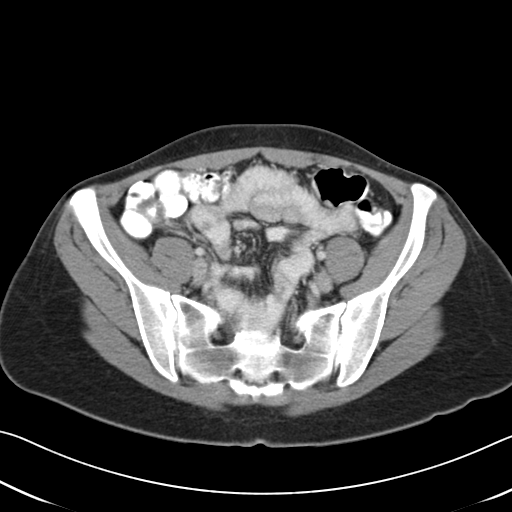
[im 40/84  soft-tissue]
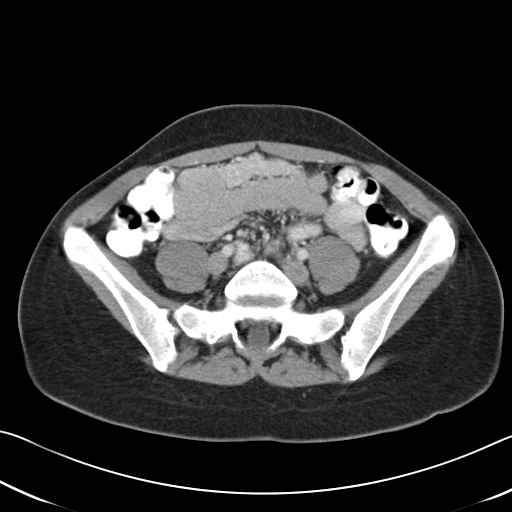
[im 44/84  soft-tissue]
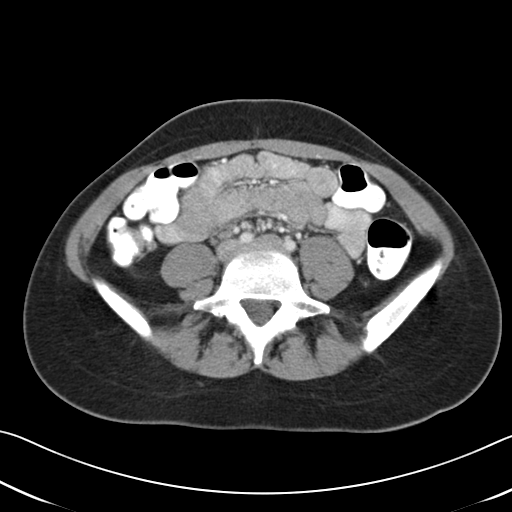
[im 50/84  soft-tissue]
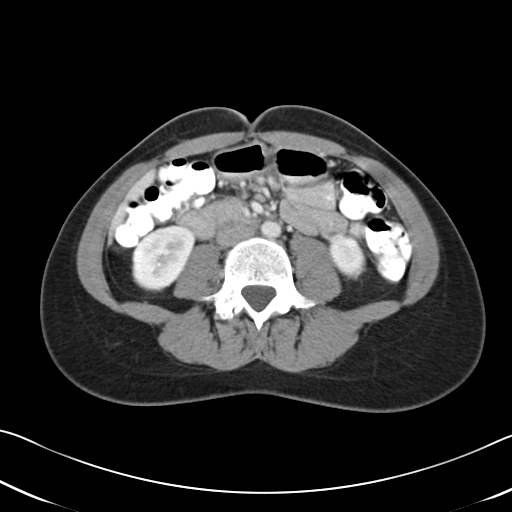
[im 50/84  bone]
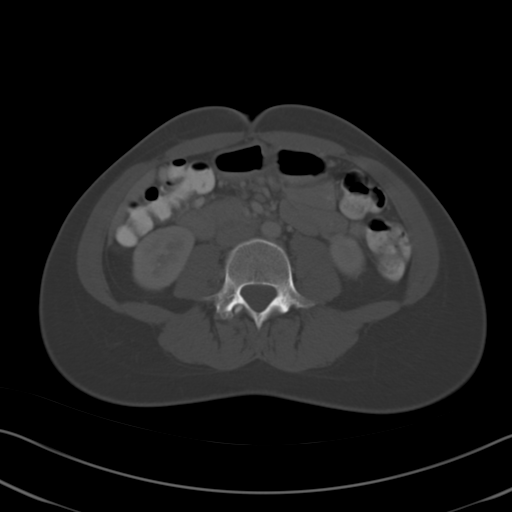
[im 57/84  soft-tissue]
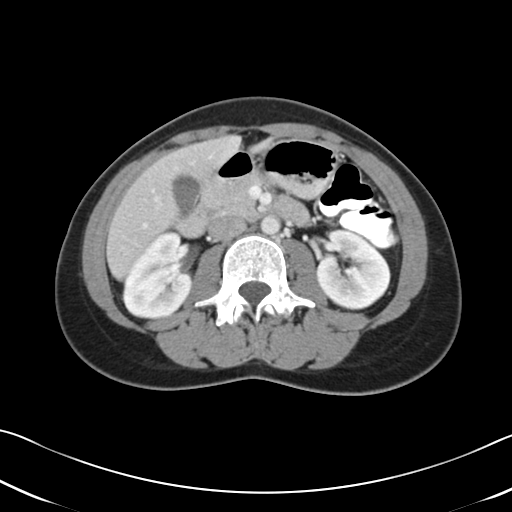
[im 64/84  soft-tissue]
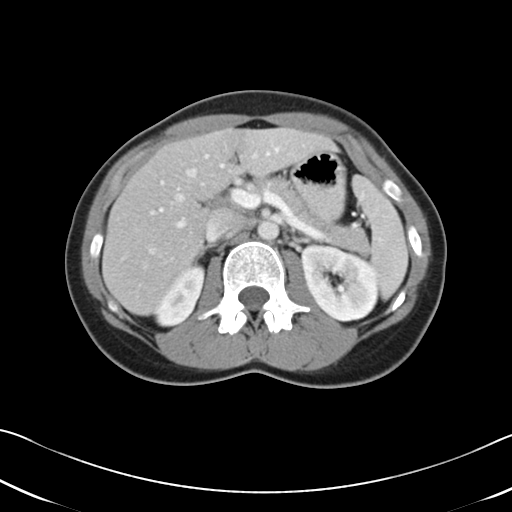
[im 67/84  soft-tissue]
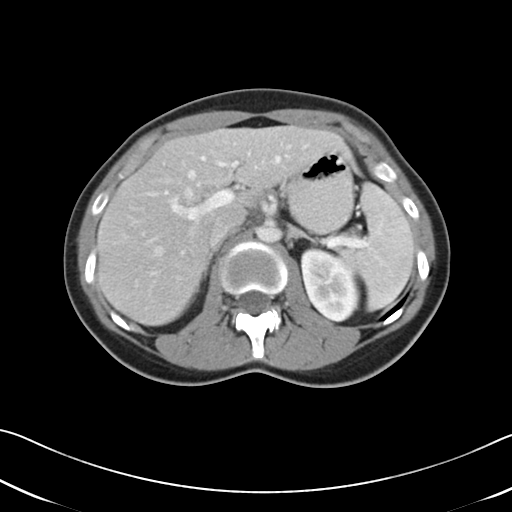
[im 74/84  soft-tissue]
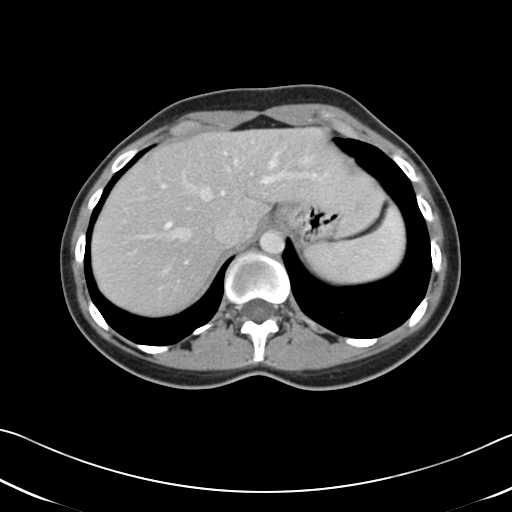
[im 80/84  soft-tissue]
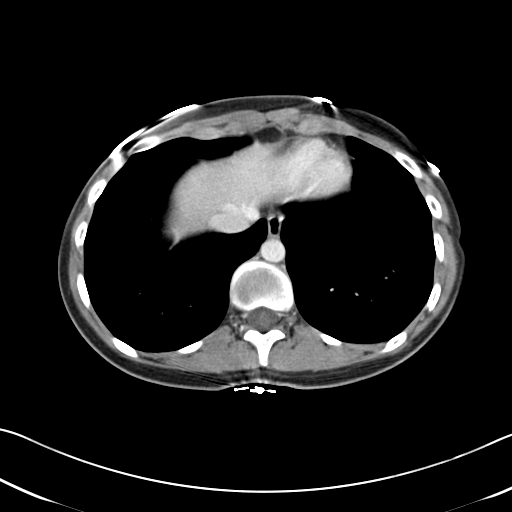

[Series 5: cor routine abd pel with · coronal · 0.69mm/px · 3 of 128 slices shown]
[im 43/128  soft-tissue]
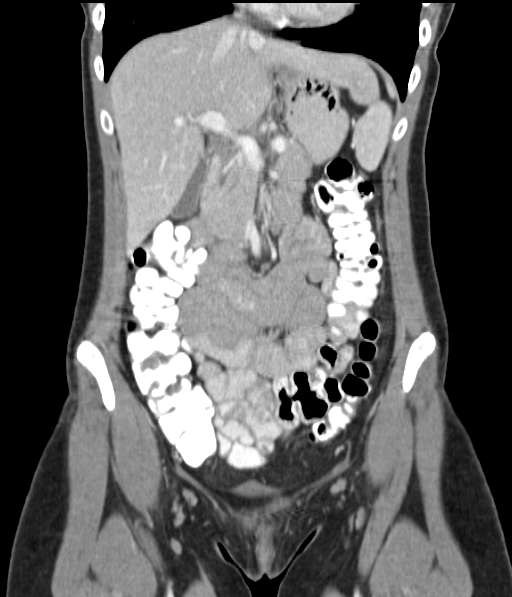
[im 57/128  soft-tissue]
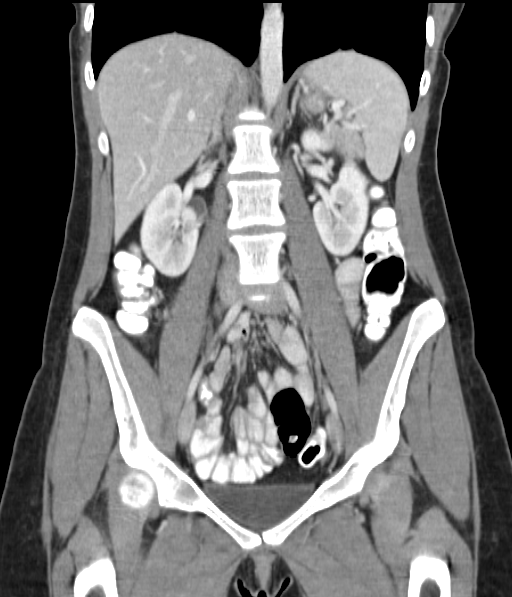
[im 71/128  soft-tissue]
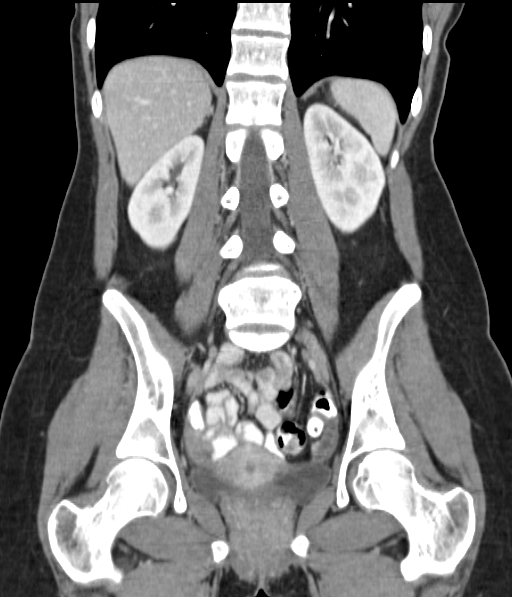

[17 of 46 positions shown; findings below may reference images not displayed]

FINDINGS: The imaged lung bases are clear.

The liver, spleen, adrenal glands, gallbladder, pancreas, and
kidneys are within normal limits. The ureters are normal in caliber.
Urinary bladder appears normal.

Abdominal aorta is normal in caliber.

Common bile duct normal in caliber. Negative for biliary ductal
dilatation.

The stomach, small bowel loops, and colon are within normal limits
for caliber and wall thickness. Normal appearance of the terminal
ileum. No mesenteric inflammatory changes. The appendix is best seen
on image 53 of the coronal reformats and is normal.

The uterus and adnexa are within normal limits for premenopausal
patient. Negative for lymphadenopathy, ascites, or free air. The
bones are intact. No acute or suspicious osseous abnormality.
IMPRESSION: No explanation for patient pain is identified. Normal appendix. No
evidence of mass, lymphadenopathy, or acute inflammatory changes.

## 2016-04-04 ENCOUNTER — Inpatient Hospital Stay
Admission: EM | Admit: 2016-04-04 | Discharge: 2016-04-06 | DRG: 775 | Disposition: A | Discharge status: Home / Self Care | Payer: Medicaid Other | Attending: Advanced Practice Midwife | Admitting: Advanced Practice Midwife

## 2016-04-04 ENCOUNTER — Encounter: Payer: Self-pay | Admitting: *Deleted

## 2016-04-04 ENCOUNTER — Observation Stay
Admission: EM | Admit: 2016-04-04 | Discharge: 2016-04-04 | Disposition: A | Discharge status: Home / Self Care | Payer: Medicaid Other | Source: Home / Self Care | Admitting: Obstetrics & Gynecology

## 2016-04-04 ENCOUNTER — Inpatient Hospital Stay: Payer: Medicaid Other | Admitting: Anesthesiology

## 2016-04-04 DIAGNOSIS — Z3A40 40 weeks gestation of pregnancy: Secondary | ICD-10-CM | POA: Diagnosis not present

## 2016-04-04 DIAGNOSIS — Z8049 Family history of malignant neoplasm of other genital organs: Secondary | ICD-10-CM | POA: Diagnosis not present

## 2016-04-04 DIAGNOSIS — O480 Post-term pregnancy: Principal | ICD-10-CM | POA: Diagnosis present

## 2016-04-04 DIAGNOSIS — Z8261 Family history of arthritis: Secondary | ICD-10-CM | POA: Diagnosis not present

## 2016-04-04 HISTORY — DX: Other constipation: K59.09

## 2016-04-04 HISTORY — DX: Palpitations: R00.2

## 2016-04-04 LAB — CHLAMYDIA/NGC RT PCR (ARMC ONLY)
Chlamydia Tr: NOT DETECTED
N GONORRHOEAE: NOT DETECTED

## 2016-04-04 LAB — TYPE AND SCREEN
ABO/RH(D): A POS
Antibody Screen: NEGATIVE

## 2016-04-04 LAB — CBC
HEMATOCRIT: 35.7 % (ref 35.0–47.0)
Hemoglobin: 12.4 g/dL (ref 12.0–16.0)
MCH: 30.2 pg (ref 26.0–34.0)
MCHC: 34.8 g/dL (ref 32.0–36.0)
MCV: 86.8 fL (ref 80.0–100.0)
PLATELETS: 270 10*3/uL (ref 150–440)
RBC: 4.11 MIL/uL (ref 3.80–5.20)
RDW: 13.5 % (ref 11.5–14.5)
WBC: 14.8 10*3/uL — ABNORMAL HIGH (ref 3.6–11.0)

## 2016-04-04 LAB — ABO/RH: ABO/RH(D): A POS

## 2016-04-04 MED ORDER — COCONUT OIL OIL: 1.0000 "application " | TOPICAL_OIL | Status: DC | PRN

## 2016-04-04 MED ORDER — LIDOCAINE-EPINEPHRINE (PF) 1.5 %-1:200000 IJ SOLN
INTRAMUSCULAR | Status: DC | PRN
  Administered 2016-04-04: 2 mL via PERINEURAL
  Administered 2016-04-04: 3 mL via PERINEURAL

## 2016-04-04 MED ORDER — SENNOSIDES-DOCUSATE SODIUM 8.6-50 MG PO TABS
2.0000 | ORAL_TABLET | ORAL | Status: DC
  Administered 2016-04-05 (×2): 2 via ORAL
  Filled 2016-04-04 (×2): qty 2

## 2016-04-04 MED ORDER — EPHEDRINE 5 MG/ML INJ
10.0000 mg | INTRAVENOUS | Status: DC | PRN
  Filled 2016-04-04: qty 2

## 2016-04-04 MED ORDER — TETANUS-DIPHTH-ACELL PERTUSSIS 5-2.5-18.5 LF-MCG/0.5 IM SUSP: 0.5000 mL | Freq: Once | INTRAMUSCULAR | Status: DC

## 2016-04-04 MED ORDER — OXYTOCIN BOLUS FROM INFUSION: 500.0000 mL | INTRAVENOUS | Status: DC

## 2016-04-04 MED ORDER — AMMONIA AROMATIC IN INHA
RESPIRATORY_TRACT | Status: AC
  Filled 2016-04-04: qty 10

## 2016-04-04 MED ORDER — ONDANSETRON HCL 4 MG/2ML IJ SOLN: 4.0000 mg | INTRAMUSCULAR | Status: DC | PRN

## 2016-04-04 MED ORDER — ACETAMINOPHEN 325 MG PO TABS: 650.0000 mg | ORAL_TABLET | ORAL | Status: DC | PRN

## 2016-04-04 MED ORDER — BENZOCAINE-MENTHOL 20-0.5 % EX AERO
1.0000 "application " | INHALATION_SPRAY | CUTANEOUS | Status: DC | PRN
  Filled 2016-04-04: qty 56

## 2016-04-04 MED ORDER — BUPIVACAINE HCL (PF) 0.25 % IJ SOLN
INTRAMUSCULAR | Status: DC | PRN
  Administered 2016-04-04 (×2): 4 mL via EPIDURAL

## 2016-04-04 MED ORDER — OXYTOCIN 10 UNIT/ML IJ SOLN
INTRAMUSCULAR | Status: AC
  Filled 2016-04-04: qty 2

## 2016-04-04 MED ORDER — PHENYLEPHRINE 40 MCG/ML (10ML) SYRINGE FOR IV PUSH (FOR BLOOD PRESSURE SUPPORT)
80.0000 ug | PREFILLED_SYRINGE | INTRAVENOUS | Status: DC | PRN
  Filled 2016-04-04: qty 5

## 2016-04-04 MED ORDER — LACTATED RINGERS IV SOLN
INTRAVENOUS | Status: DC
  Administered 2016-04-04: 1000 mL via INTRAVENOUS
  Administered 2016-04-04: 08:00:00 via INTRAVENOUS
  Administered 2016-04-04: 1000 mL via INTRAVENOUS

## 2016-04-04 MED ORDER — ONDANSETRON HCL 4 MG PO TABS: 4.0000 mg | ORAL_TABLET | ORAL | Status: DC | PRN

## 2016-04-04 MED ORDER — DIBUCAINE 1 % RE OINT: 1.0000 "application " | TOPICAL_OINTMENT | RECTAL | Status: DC | PRN

## 2016-04-04 MED ORDER — OXYTOCIN 40 UNITS IN LACTATED RINGERS INFUSION - SIMPLE MED
INTRAVENOUS | Status: AC
  Administered 2016-04-04: 2.5 [IU]/h via INTRAVENOUS
  Filled 2016-04-04: qty 1000

## 2016-04-04 MED ORDER — DIPHENHYDRAMINE HCL 25 MG PO CAPS: 25.0000 mg | ORAL_CAPSULE | Freq: Four times a day (QID) | ORAL | Status: DC | PRN

## 2016-04-04 MED ORDER — SIMETHICONE 80 MG PO CHEW: 80.0000 mg | CHEWABLE_TABLET | ORAL | Status: DC | PRN

## 2016-04-04 MED ORDER — BUTORPHANOL TARTRATE 1 MG/ML IJ SOLN: 1.0000 mg | INTRAMUSCULAR | Status: DC | PRN

## 2016-04-04 MED ORDER — FENTANYL 2.5 MCG/ML W/ROPIVACAINE 0.2% IN NS 100 ML EPIDURAL INFUSION (ARMC-ANES)
10.0000 mL/h | EPIDURAL | Status: DC
  Administered 2016-04-04: 10 mL/h via EPIDURAL
  Filled 2016-04-04: qty 100

## 2016-04-04 MED ORDER — ONDANSETRON HCL 4 MG/2ML IJ SOLN: 4.0000 mg | Freq: Four times a day (QID) | INTRAMUSCULAR | Status: DC | PRN

## 2016-04-04 MED ORDER — LACTATED RINGERS IV SOLN: 500.0000 mL | Freq: Once | INTRAVENOUS | Status: DC

## 2016-04-04 MED ORDER — LACTATED RINGERS IV SOLN
500.0000 mL | INTRAVENOUS | Status: DC | PRN
  Administered 2016-04-04: 500 mL via INTRAVENOUS

## 2016-04-04 MED ORDER — WITCH HAZEL-GLYCERIN EX PADS: 1.0000 "application " | MEDICATED_PAD | CUTANEOUS | Status: DC | PRN

## 2016-04-04 MED ORDER — OXYTOCIN 40 UNITS IN LACTATED RINGERS INFUSION - SIMPLE MED
2.5000 [IU]/h | INTRAVENOUS | Status: DC
  Administered 2016-04-04: 2.5 [IU]/h via INTRAVENOUS

## 2016-04-04 MED ORDER — PRENATAL MULTIVITAMIN CH
1.0000 | ORAL_TABLET | Freq: Every day | ORAL | Status: DC
  Administered 2016-04-05 – 2016-04-06 (×2): 1 via ORAL
  Filled 2016-04-04 (×2): qty 1

## 2016-04-04 MED ORDER — LIDOCAINE HCL (PF) 1 % IJ SOLN
INTRAMUSCULAR | Status: AC
  Administered 2016-04-04: 3 mL
  Filled 2016-04-04: qty 30

## 2016-04-04 MED ORDER — IBUPROFEN 600 MG PO TABS
600.0000 mg | ORAL_TABLET | Freq: Four times a day (QID) | ORAL | Status: DC
  Administered 2016-04-04 – 2016-04-06 (×7): 600 mg via ORAL
  Filled 2016-04-04 (×7): qty 1

## 2016-04-04 MED ORDER — MISOPROSTOL 200 MCG PO TABS
ORAL_TABLET | ORAL | Status: AC
  Filled 2016-04-04: qty 1

## 2016-04-04 MED ORDER — DIPHENHYDRAMINE HCL 50 MG/ML IJ SOLN: 12.5000 mg | INTRAMUSCULAR | Status: DC | PRN

## 2016-04-04 MED ORDER — MISOPROSTOL 200 MCG PO TABS
ORAL_TABLET | ORAL | Status: AC
  Filled 2016-04-04: qty 4
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
## 2016-04-04 NOTE — OB Triage Note (Signed)
Pt presents to [facility] triage with reports she has been feeling sharp shooting pain in pelvis since 1600 this evening, starting coming more frequently since 1800 tonight, coming every 5-7 mins, lasting 60-90 sec. Says she has been feeling pain radiating around from lower back to lower abdomen and since 2300 pain is worse, tried moving around, drank fluids, walked around and layed on Lt side, still did not get any relief. Noticed brownish and blood tinged mucus. Last seen in clinic this past Wed, cervix 1 cm. GBS normal. Confirms +FM, denies spotting, leaking or gush of fluid, n/v, urinary sym, or unusual discharge/odor.

## 2016-04-04 NOTE — Progress Notes (Signed)
  Labor Progress Note   25 y.o. G1P0 @ [redacted]w[redacted]d , admitted for  Pregnancy, Labor Management. Contractions  Subjective:  Pt stated that contractions were getting stronger when CNM visited earlier this morning and was requesting an epidural. She is now comfortable with the epidural.  Objective:  BP 91/52 mmHg  Pulse 70  Temp(Src) 97.5 F (36.4 C) (Oral)  Resp 16  Ht 5\' 5"  (1.651 m)  Wt 64.411 kg (142 lb)  BMI 23.63 kg/m2  SpO2 100% Abd: mild Extr: trace to 1+ bilateral pedal edema SVE: CERVIX: 4 cm dilated, 90 effaced, +1 station per RN exam  EFM: FHR: 115 bpm, variability: moderate,  accelerations:  Present,  decelerations:  Present 5 minute deceleration while placing foley catheter. Repositioned and baby recovered to baseline. Toco: Frequency: Every 3 minutes Labs: I have reviewed the patient's lab results.   Assessment & Plan:  G1P0 @ [redacted]w[redacted]d, admitted for  Pregnancy and Labor/Delivery Management  1. Pain management: epidural. 2. FWB: FHT category 2.  3. ID: GBS negative 4. Labor management: expectant, pitocin augmentation as needed All discussed with patient, see orders  Lillyth Spong, CNM

## 2016-04-04 NOTE — Anesthesia Preprocedure Evaluation (Signed)
Anesthesia Evaluation  Patient identified by MRN, date of birth, ID band Patient awake    Reviewed: Allergy & Precautions, NPO status , Patient's Chart, lab work & pertinent test results, reviewed documented beta blocker date and time   Airway Mallampati: II  TM Distance: >3 FB     Dental  (+) Chipped   Pulmonary           Cardiovascular      Neuro/Psych    GI/Hepatic   Endo/Other    Renal/GU      Musculoskeletal   Abdominal   Peds  Hematology   Anesthesia Other Findings   Reproductive/Obstetrics                             Anesthesia Physical Anesthesia Plan  ASA: II  Anesthesia Plan: Epidural   Post-op Pain Management:    Induction: Intravenous  Airway Management Planned:   Additional Equipment:   Intra-op Plan:   Post-operative Plan:   Informed Consent: I have reviewed the patients History and Physical, chart, labs and discussed the procedure including the risks, benefits and alternatives for the proposed anesthesia with the patient or authorized representative who has indicated his/her understanding and acceptance.     Plan Discussed with: CRNA  Anesthesia Plan Comments:         Anesthesia Quick Evaluation

## 2016-04-04 NOTE — OB Triage Note (Signed)
Discharge teaching and labor precautions with handouts to pt. Encouraged rest, warm shower, staying hydrated and keeping appt for IOL on Sunday.

## 2016-04-04 NOTE — Progress Notes (Signed)
  Labor Progress Note   25 y.o. G1P0 @ [redacted]w[redacted]d , admitted for  Pregnancy, Labor Management. Active labor  Subjective:  Pt is doing well and comfortable with epidural. She felt a little bit of pressure with cervical exam.  Objective:  BP 101/56 mmHg  Pulse 79  Temp(Src) 97.5 F (36.4 C) (Oral)  Resp 16  Ht 5\' 5"  (1.651 m)  Wt 64.411 kg (142 lb)  BMI 23.63 kg/m2  SpO2 100% Abd: mild Extr: trace to 1+ bilateral pedal edema SVE: CERVIX: 7 cm dilated, 100 effaced, +1 station  EFM: FHR: 115 bpm, variability: moderate,  accelerations:  Present,  decelerations:  Absent Toco: Frequency: Difficult to assess with pt lying on her side    Assessment & Plan:  G1P0 @ [redacted]w[redacted]d, admitted for  Pregnancy and Labor/Delivery Management  1. Pain management: epidural. 2. FWB: FHT category I.  3. ID: GBS negative 4. Labor management: expectant All discussed with patient  Rod Can, CNM

## 2016-04-04 NOTE — OB Triage Note (Signed)
Returned to BP this morning with CTX every 2-4 mins and spotting.

## 2016-04-04 NOTE — Discharge Summary (Signed)
OB Discharge Summary  Patient Name: Karen Holden DOB: [birth year] MRN: [id number]  Date of admission: 04/04/2016 Delivering MD: Rod Can, CNM Date of Delivery: 04/04/2016  Date of discharge: 04/06/2016  Admitting diagnosis: 40 weeks active labor Intrauterine pregnancy: [redacted]w[redacted]d     Secondary diagnosis: None     Discharge diagnosis: Term Pregnancy Delivered                                                                                                Post partum procedures:none  Augmentation: none  Complications: None  Hospital course:  Onset of Labor With Vaginal Delivery     25 y.o. yo G1P0 at [redacted]w[redacted]d was admitted in Active Labor on 04/04/2016. Patient had an uncomplicated labor course as follows:   Membrane Rupture Time/Date: 6:00 PM ,04/04/2016   Intrapartum Procedures: Epidural, AROM for scant fluid Episiotomy: None  Lacerations:  Tiny perineal split no repair Patient had a delivery of a Viable female infant.04/04/2016 / Karen Holden Delivery Method: Vaginal, Spontaneous Delivery  Details of delivery can be found in a separate note    Patient had an uncomplicated postpartum course.  She is ambulating, tolerating a regular diet, passing flatus (had BM), and urinating well. Baby is breast feeding well and frequently. Patient is discharged home in stable condition on 04/06/2016.    Physical exam  Filed Vitals:   04/05/16 1214 04/05/16 1552 04/05/16 2000 04/06/16 0741  BP: 111/67 110/74 109/62 101/64  Pulse: 74 80 85 89  Temp: 98.6 F (37 C) 98 F (36.7 C) 98.5 F (36.9 C) 97.9 F (36.6 C)  TempSrc: Oral Oral  Oral  Resp: 20 20 18 18   Height:      Weight:      SpO2:    100%   General: alert, cooperative and no distress Lochia: appropriate Uterine Fundus: firm at U-1/ML/NT Incision: N/A DVT Evaluation: No evidence of DVT seen on physical exam.  Labs: Lab Results  Component Value Date   WBC 15.6* 04/05/2016   HGB 10.5* 04/05/2016   HCT 30.4* 04/05/2016   MCV 86.8  04/05/2016   PLT 215 04/05/2016   Discharge instruction: per After Visit Summary.  Medications:    Medication List    STOP taking these medications        DICLEGIS PO     escitalopram 10 MG tablet  Commonly known as:  LEXAPRO     Norgestimate-Ethinyl Estradiol Triphasic 0.18/0.215/0.25 MG-35 MCG tablet  Commonly known as:  ORTHO TRI-CYCLEN (28)      TAKE these medications        COLACE PO  Take 1 tablet by mouth daily.     ibuprofen 600 MG tablet  Commonly known as:  ADVIL,MOTRIN  Take 1 tablet (600 mg total) by mouth every 6 (six) hours as needed for mild pain, moderate pain or cramping.     PRENATAL VITAMIN PO  Take 1 tablet by mouth daily.        Diet: routine diet  Activity: Advance as tolerated. Pelvic rest for 6 weeks.   Outpatient follow up: 6 week postpartum check Postpartum contraception: desires OTC  if she stops breastfeeding in 6 weeks or POP if breast feeding Rhogam Given postpartum: NA Rubella vaccine given postpartum: no Varicella vaccine given postpartum: no TDaP given antepartum or postpartum: TDaP given 07/13/15 Flu vaccine given antepartum or postpartum: no  Newborn Data: Live born female / Karen Holden Birth Weight: 3210  APGAR: 9, 9   Baby Feeding: Breast  Disposition:home with mother  Dalia Heading, CNM
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
## 2016-04-04 NOTE — Anesthesia Procedure Notes (Signed)
Epidural Patient location during procedure: OB Start time: 04/04/2016 9:45 AM End time: 04/04/2016 10:05 AM  Staffing Anesthesiologist: Gunnar Bulla Resident/CRNA: Johnna Acosta Performed by: resident/CRNA   Preanesthetic Checklist Completed: patient identified, site marked, surgical consent, pre-op evaluation, timeout performed, IV checked, risks and benefits discussed and monitors and equipment checked  Epidural Patient position: sitting Prep: Betadine Patient monitoring: heart rate, continuous pulse ox and blood pressure Approach: midline Location: L3-L4 Injection technique: LOR saline  Needle:  Needle type: Tuohy  Needle gauge: 17 G Needle length: 9 cm and 9 Needle insertion depth: 7 cm Catheter type: closed end flexible Catheter size: 19 Gauge Catheter at skin depth: 13 cm Test dose: negative and 1.5% lidocaine with Epi 1:200 K  Assessment Sensory level: T8 Events: blood not aspirated, injection not painful, no injection resistance, negative IV test and no paresthesia  Additional Notes   Patient tolerated the insertion well without immediate complications.Reason for block:procedure for pain

## 2016-04-05 LAB — CBC
HCT: 30.4 % — ABNORMAL LOW (ref 35.0–47.0)
Hemoglobin: 10.5 g/dL — ABNORMAL LOW (ref 12.0–16.0)
MCH: 30 pg (ref 26.0–34.0)
MCHC: 34.6 g/dL (ref 32.0–36.0)
MCV: 86.8 fL (ref 80.0–100.0)
PLATELETS: 215 10*3/uL (ref 150–440)
RBC: 3.51 MIL/uL — ABNORMAL LOW (ref 3.80–5.20)
RDW: 14 % (ref 11.5–14.5)
WBC: 15.6 10*3/uL — AB (ref 3.6–11.0)

## 2016-04-05 LAB — RPR: RPR: NONREACTIVE

## 2016-04-05 NOTE — Progress Notes (Signed)
  Postpartum Day 1  Subjective: no complaints, up ad lib, voiding and tolerating PO  Objective: Blood pressure 109/57, pulse 80, temperature 97.6 F (36.4 C), temperature source Oral, resp. rate 18, height 5\' 5"  (1.651 m), weight 142 lb (64.411 kg), SpO2 100 %.  Physical Exam:  General: alert and cooperative Lochia: appropriate Uterine Fundus: firm Incision: N/A DVT Evaluation: No evidence of DVT seen on physical exam. Abdomen: soft, NT   Recent Labs  04/04/16 0810 04/05/16 0553  HGB 12.4 10.5*  HCT 35.7 30.4*    Assessment PPD #1  Plan: Discharge tomorrow and Continue PP care  Feeding: breast (x 6 wks only) Contraception: orthotricyclen after 6 weeks Blood Type: A+ RI/VI TDAP UTD    Burlene Arnt, [place] 04/05/2016, 10:55 AM

## 2016-04-06 MED ORDER — IBUPROFEN 600 MG PO TABS: 600.0000 mg | ORAL_TABLET | Freq: Four times a day (QID) | ORAL | Status: DC | PRN

## 2016-04-06 NOTE — Anesthesia Postprocedure Evaluation (Signed)
Anesthesia Post Note  Patient: Karen Holden  Procedure(s) Performed: * No procedures listed *  Patient location during evaluation: Mother Baby Anesthesia Type: General Level of consciousness: awake and alert Pain management: pain level controlled Vital Signs Assessment: post-procedure vital signs reviewed and stable Respiratory status: spontaneous breathing, nonlabored ventilation, respiratory function stable and patient connected to nasal cannula oxygen Cardiovascular status: blood pressure returned to baseline and stable Postop Assessment: no signs of nausea or vomiting Anesthetic complications: no    Last Vitals: There were no vitals filed for this visit.  Last Pain: There were no vitals filed for this visit.               Crystal Ellwood S

## 2016-04-06 NOTE — Discharge Instructions (Signed)
Vaginal Delivery, Care After Refer to this sheet in the next few weeks. These discharge instructions provide you with information on caring for yourself after delivery. Your caregiver may also give you specific instructions. Your treatment has been planned according to the most current medical practices available, but problems sometimes occur. Call your caregiver if you have any problems or questions after you go home. HOME CARE INSTRUCTIONS 1. Take over-the-counter or prescription medicines only as directed by your caregiver or pharmacist. 2. Do not drink alcohol, especially if you are breastfeeding or taking medicine to relieve pain. 3. Do not smoke tobacco. 4. Continue to use good perineal care. Good perineal care includes: 1. Wiping your perineum from back to front 2. Keeping your perineum clean. 3. You can do sitz baths twice a day, to help keep this area clean 5. Do not use tampons, douche or have sex for 4-6 weeks 6. Shower only and avoid sitting in submerged water, aside from sitz baths 7. Wear a well-fitting bra that provides breast support. 8. Eat healthy foods. 9. Drink enough fluids to keep your urine clear or pale yellow. 10. Eat high-fiber foods such as whole grain cereals and breads, brown rice, beans, and fresh fruits and vegetables every day. These foods may help prevent or relieve constipation. 11. Avoid constipation with high fiber foods or medications, such as miralax or metamucil 12. Follow your caregiver's recommendations regarding resumption of activities such as climbing stairs, driving, lifting, exercising, or traveling. 53. Talk to your caregiver about resuming sexual activities. Resumption of sexual activities is dependent upon your risk of infection, your rate of healing, and your comfort and desire to resume sexual activity. 14. Try to have someone help you with your household activities and your newborn for at least a few days after you leave the  hospital. 15. Rest as much as possible. Try to rest or take a nap when your newborn is sleeping. 16. Increase your activities gradually. No heavy lifting x 4 weeks 17. Keep all of your scheduled postpartum appointments. It is very important to keep your scheduled follow-up appointments. At these appointments, your caregiver will be checking to make sure that you are healing physically and emotionally. SEEK MEDICAL CARE IF:   You are passing large clots from your vagina. Save any clots to show your caregiver.  You have a foul smelling discharge from your vagina.  You have trouble urinating.  You are urinating frequently.  You have pain when you urinate.  You have a change in your bowel movements.  You have increasing redness, pain, or swelling near your vaginal incision (episiotomy) or vaginal tear.  You have pus draining from your episiotomy or vaginal tear.  Your episiotomy or vaginal tear is separating.  You have painful, hard, or reddened breasts.  You have a severe headache.  You have blurred vision or see spots.  You feel sad or depressed.  You have thoughts of hurting yourself or your newborn.  You have questions about your care, the care of your newborn, or medicines.  You are dizzy or light-headed.  You have a rash.  You have nausea or vomiting.  You were breastfeeding and have not had a menstrual period within 12 weeks after you stopped breastfeeding.  You are not breastfeeding and have not had a menstrual period by the 12th week after delivery.  You have a fever. SEEK IMMEDIATE MEDICAL CARE IF:   You have persistent pain.  You have chest pain.  You have shortness  of breath.  You faint.  You have leg pain.  You have stomach pain.  Your vaginal bleeding saturates two or more sanitary pads in 1 hour. MAKE SURE YOU:   Understand these instructions.  Will watch your condition.  Will get help right away if you are not doing well or get  worse. Document Released: 11/21/2000 Document Revised: 04/10/2014 Document Reviewed: 07/21/2012 [facility] Patient Information 2015 [place], [place]. This information is not intended to replace advice given to you by your health care provider. Make sure you discuss any questions you have with your health care provider.  Sitz Bath A sitz bath is a warm water bath taken in the sitting position. The water covers only the hips and butt (buttocks). We recommend using one that fits in the toilet, to help with ease of use and cleanliness. It may be used for either healing or cleaning purposes. Sitz baths are also used to relieve pain, itching, or muscle tightening (spasms). The water may contain medicine. Moist heat will help you heal and relax.  HOME CARE  Take 3 to 4 sitz baths a day. 18. Fill the bathtub half-full with warm water. 19. Sit in the water and open the drain a little. 20. Turn on the warm water to keep the tub half-full. Keep the water running constantly. 21. Soak in the water for 15 to 20 minutes. 22. After the sitz bath, pat the affected area dry. GET HELP RIGHT AWAY IF: You get worse instead of better. Stop the sitz baths if you get worse. MAKE SURE YOU:  Understand these instructions.  Will watch your condition.  Will get help right away if you are not doing well or get worse. Document Released: 01/01/2005 Document Revised: 08/18/2012 Document Reviewed: 03/24/2011 [facility] Patient Information 2015 [place], [place]. This information is not intended to replace advice given to you by your health care provider. Make sure you discuss any questions you have with your health care provider.

## 2016-04-06 NOTE — Progress Notes (Signed)
Patient discharged home with infant and significant other. Discharge instructions, prescriptions and follow up appointment given to and reviewed with patient and significant other. Patient verbalized understanding. Escorted out via wheelchair by Perley Jain, RN.

## 2016-04-07 ENCOUNTER — Telehealth: Payer: Self-pay

## 2016-04-07 NOTE — Telephone Encounter (Signed)
No TCM call or appointment required at this time.  Follow up with OB/GYN.

## 2017-05-14 ENCOUNTER — Encounter: Payer: Self-pay | Admitting: *Deleted

## 2017-05-14 ENCOUNTER — Emergency Department: Payer: Self-pay

## 2017-05-14 ENCOUNTER — Emergency Department
Admission: EM | Admit: 2017-05-14 | Discharge: 2017-05-14 | Disposition: A | Discharge status: Home / Self Care | Payer: Self-pay | Attending: Emergency Medicine | Admitting: Emergency Medicine

## 2017-05-14 DIAGNOSIS — Y929 Unspecified place or not applicable: Secondary | ICD-10-CM | POA: Insufficient documentation

## 2017-05-14 DIAGNOSIS — R0782 Intercostal pain: Secondary | ICD-10-CM | POA: Insufficient documentation

## 2017-05-14 DIAGNOSIS — X509XXA Other and unspecified overexertion or strenuous movements or postures, initial encounter: Secondary | ICD-10-CM | POA: Insufficient documentation

## 2017-05-14 DIAGNOSIS — Y999 Unspecified external cause status: Secondary | ICD-10-CM | POA: Insufficient documentation

## 2017-05-14 DIAGNOSIS — Z79899 Other long term (current) drug therapy: Secondary | ICD-10-CM | POA: Insufficient documentation

## 2017-05-14 DIAGNOSIS — R0781 Pleurodynia: Secondary | ICD-10-CM

## 2017-05-14 DIAGNOSIS — Y939 Activity, unspecified: Secondary | ICD-10-CM | POA: Insufficient documentation

## 2017-05-14 MED ORDER — LIDOCAINE 5 % EX PTCH
1.0000 | MEDICATED_PATCH | CUTANEOUS | Status: DC
  Administered 2017-05-14: 1 via TRANSDERMAL
  Filled 2017-05-14: qty 1

## 2017-05-14 MED ORDER — IBUPROFEN 600 MG PO TABS
600.0000 mg | ORAL_TABLET | Freq: Once | ORAL | Status: AC
  Administered 2017-05-14: 600 mg via ORAL
  Filled 2017-05-14: qty 1

## 2017-05-14 MED ORDER — LIDOCAINE 5 % EX PTCH: 1.0000 | MEDICATED_PATCH | CUTANEOUS | 0 refills | Status: AC

## 2017-05-14 MED ORDER — NAPROXEN 500 MG PO TABS: 500.0000 mg | ORAL_TABLET | Freq: Two times a day (BID) | ORAL | 0 refills | Status: AC

## 2017-05-14 NOTE — ED Triage Notes (Signed)
Pt states she was scooting on the bed with her son and felt a pop on the left side of her back
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
## 2017-05-14 NOTE — ED Notes (Signed)
See triage note  Felt a pop to left side of back  Denies any fall but was scooting on bed  Ambulates well to treatment room

## 2017-05-25 ENCOUNTER — Ambulatory Visit: Payer: Self-pay | Admitting: Advanced Practice Midwife

## 2019-03-12 IMAGING — CR DG RIBS W/ CHEST 3+V*L*
1 series · 3 of 3 positions shown · non-contrast
Comparison: Radiograph July 31, 2010.

CLINICAL DATA: Left back pain after injury.

EXAM:
LEFT RIBS AND CHEST - 3+ VIEW

[Series 1: dg ribs unilateral w/chest left · 0.14mm/px · 3 of 3 slices shown]
[im 1/3]
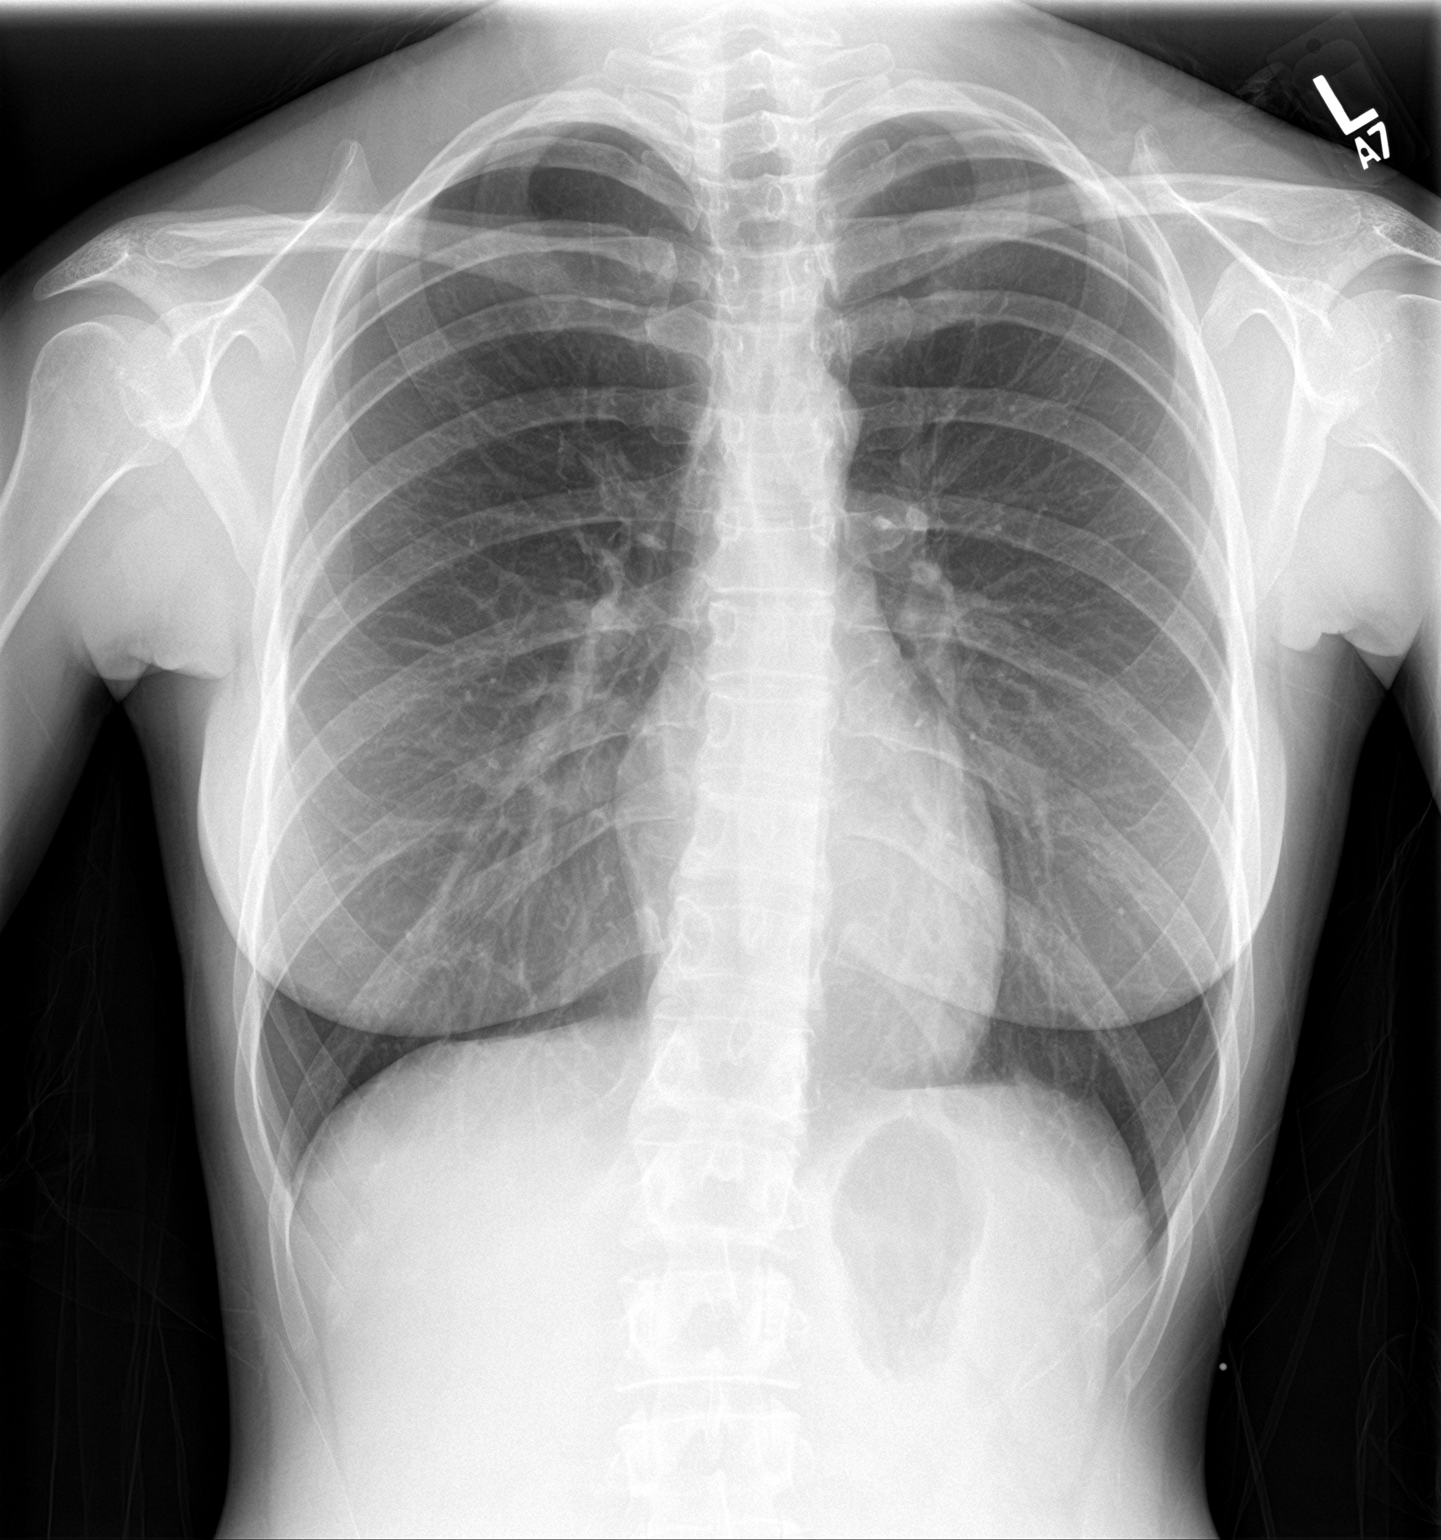
[im 2/3]
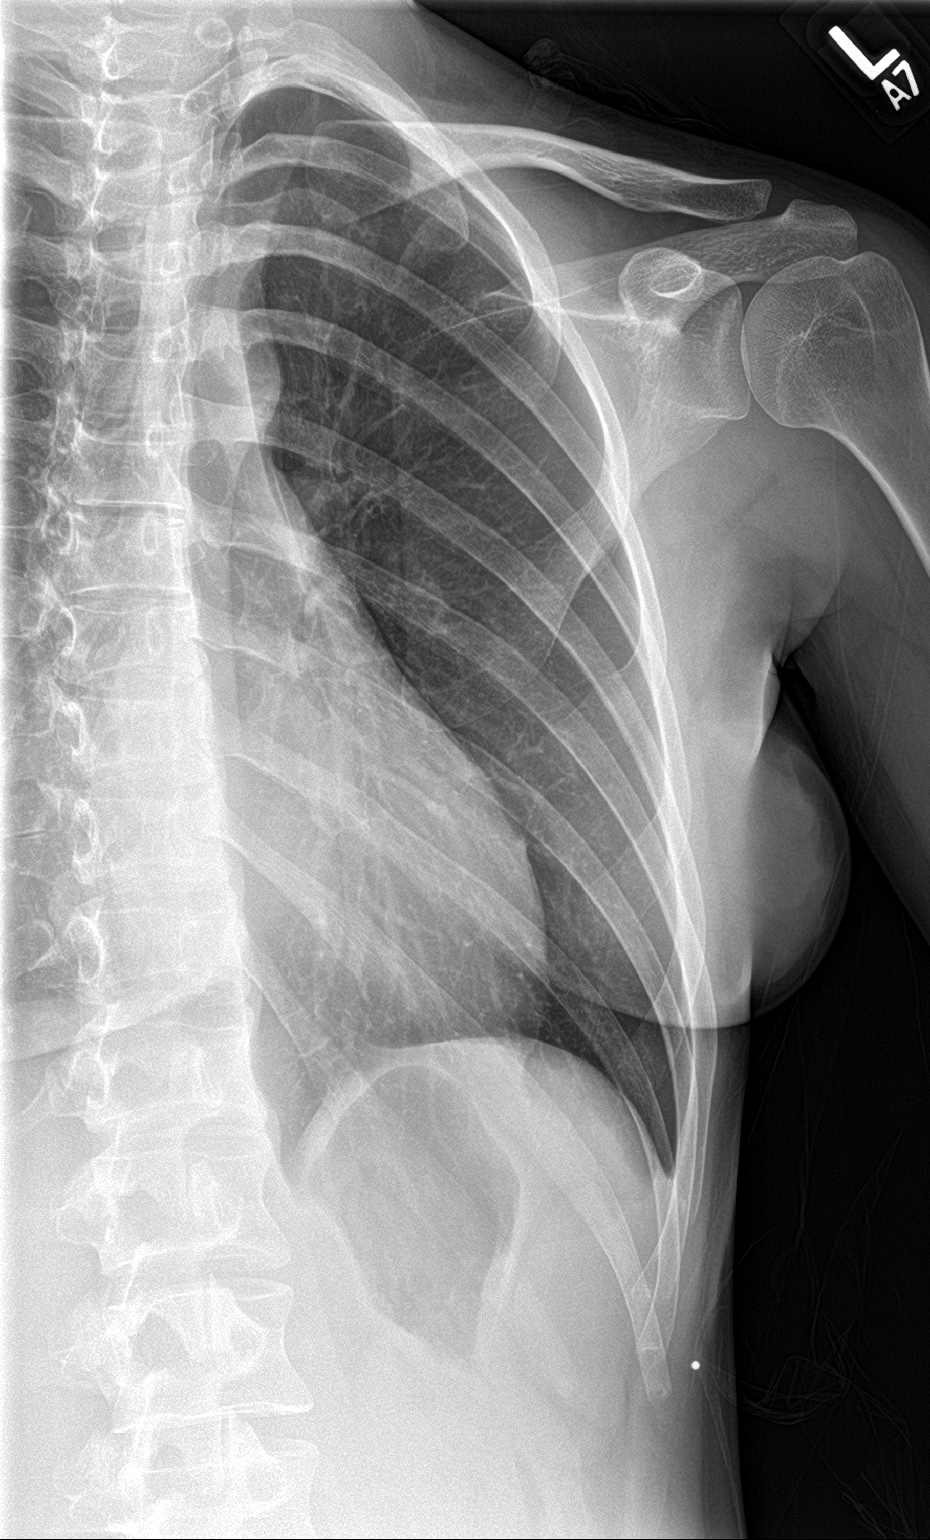
[im 3/3]
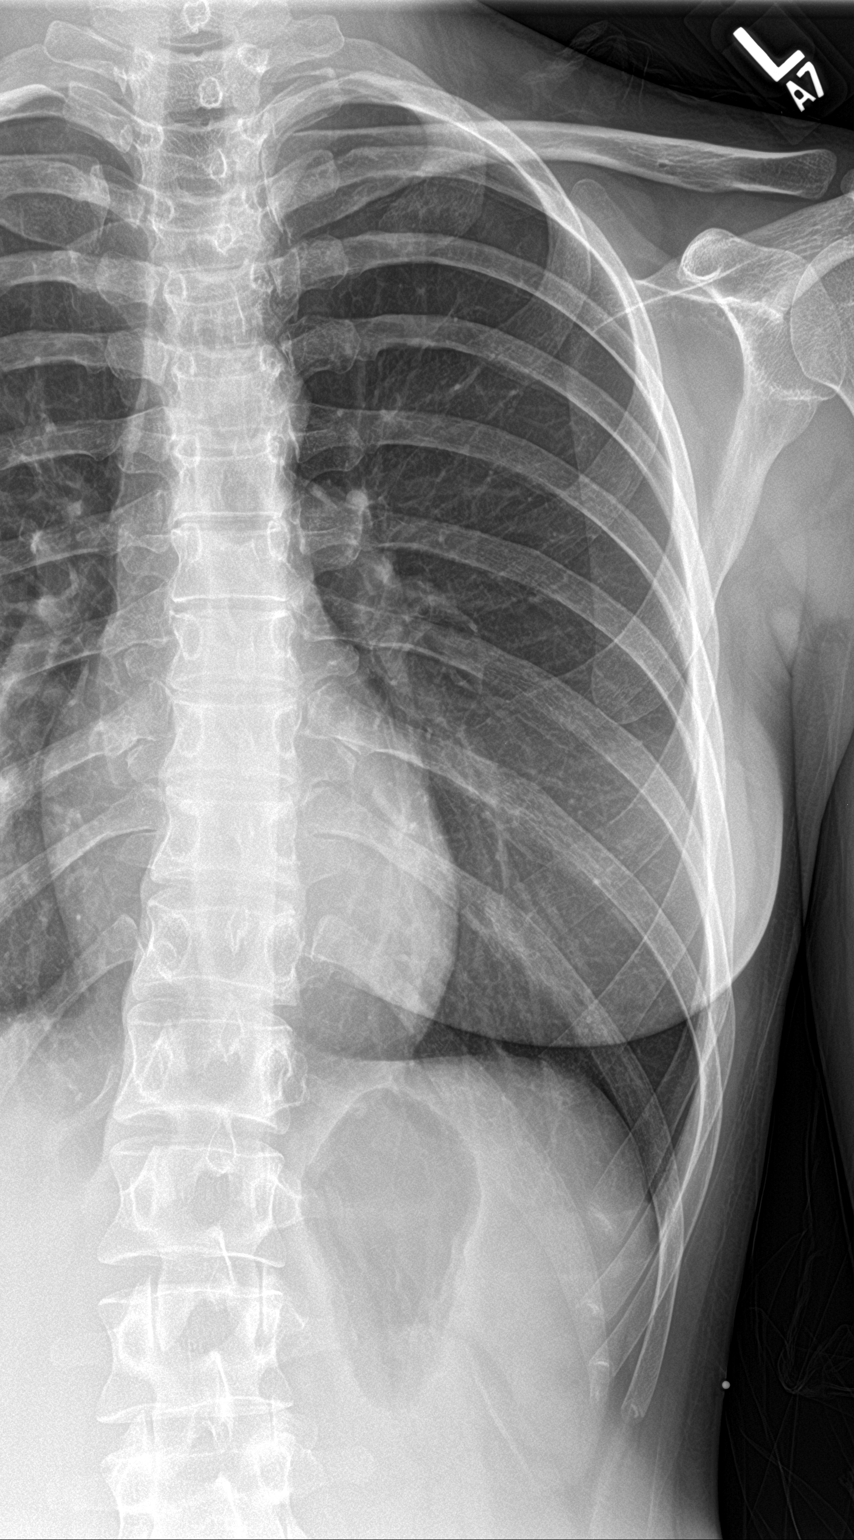

[3 of 3 positions shown; findings below may reference images not displayed]

FINDINGS: No fracture or other bone lesions are seen involving the ribs. There
is no evidence of pneumothorax or pleural effusion. Both lungs are
clear. Heart size and mediastinal contours are within normal limits.
IMPRESSION: Normal left ribs.  No acute cardiopulmonary abnormality seen.

## 2021-09-20 ENCOUNTER — Ambulatory Visit (INDEPENDENT_AMBULATORY_CARE_PROVIDER_SITE_OTHER): Payer: 59 | Admitting: Family

## 2021-09-20 ENCOUNTER — Other Ambulatory Visit: Payer: Self-pay

## 2021-09-20 ENCOUNTER — Encounter: Payer: Self-pay | Admitting: Family

## 2021-09-20 VITALS — BP 96/54 | HR 62 | Ht 65.0 in | Wt 117.6 lb

## 2021-09-20 DIAGNOSIS — I950 Idiopathic hypotension: Secondary | ICD-10-CM | POA: Insufficient documentation

## 2021-09-20 DIAGNOSIS — F419 Anxiety disorder, unspecified: Secondary | ICD-10-CM | POA: Diagnosis not present

## 2021-09-20 DIAGNOSIS — N946 Dysmenorrhea, unspecified: Secondary | ICD-10-CM | POA: Insufficient documentation

## 2021-09-20 DIAGNOSIS — B009 Herpesviral infection, unspecified: Secondary | ICD-10-CM

## 2021-09-20 DIAGNOSIS — F32A Depression, unspecified: Secondary | ICD-10-CM

## 2021-09-20 DIAGNOSIS — Z8349 Family history of other endocrine, nutritional and metabolic diseases: Secondary | ICD-10-CM | POA: Diagnosis not present

## 2021-09-20 LAB — TSH: TSH: 1.37 u[IU]/mL (ref 0.35–5.50)

## 2021-09-20 LAB — T4, FREE: Free T4: 0.54 ng/dL — ABNORMAL LOW (ref 0.60–1.60)

## 2021-09-20 MED ORDER — SERTRALINE HCL 25 MG PO TABS: 25.0000 mg | ORAL_TABLET | Freq: Every day | ORAL | 0 refills | Status: DC
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
## 2021-09-20 NOTE — Patient Instructions (Addendum)
Welcome to [facility]! It was a pleasure meeting you today. Go to the lab for thyroid check today and we will notify you through MyChart with the results. I have sent the Zoloft medication to your pharmacy. You can use MyChart to communicate with me or any staff. Please schedule a 1 month follow up for your anxiety/depression and the new medication.

## 2021-09-20 NOTE — Assessment & Plan Note (Addendum)
DX at age 30, started Lexapro which caused some wt gain and no labido. Currently wants to gain some weight. Will start Zoloft, consider adding Wellbutrin if SE develop.

## 2021-09-20 NOTE — Assessment & Plan Note (Signed)
Pt reports having a hx of low BP. Reports she only eats 1 time/day on most days.  Advised pt on proper hydration and good eating habits through the day.

## 2021-09-20 NOTE — Assessment & Plan Note (Signed)
Treated successfully with Valtrex in the past.

## 2021-09-20 NOTE — Assessment & Plan Note (Addendum)
Reports having since she had 28 year old son. Denies heavy flow or irregularity. Reports moodiness pre & post. Took Orthotricyclen after giving birth, but caused nausea and worsened pain.

## 2021-09-22 ENCOUNTER — Encounter: Payer: Self-pay | Admitting: Family

## 2021-09-27 ENCOUNTER — Encounter: Payer: Self-pay | Admitting: Family

## 2021-09-30 ENCOUNTER — Other Ambulatory Visit: Payer: Self-pay | Admitting: Family

## 2021-10-01 ENCOUNTER — Other Ambulatory Visit: Payer: Self-pay

## 2021-10-01 MED ORDER — ESCITALOPRAM OXALATE 10 MG PO TABS: 10.0000 mg | ORAL_TABLET | Freq: Every day | ORAL | 0 refills | Status: DC

## 2021-10-03 ENCOUNTER — Ambulatory Visit: Payer: Managed Care, Other (non HMO) | Admitting: Family

## 2021-10-23 ENCOUNTER — Encounter: Payer: Self-pay | Admitting: Family

## 2021-10-23 ENCOUNTER — Ambulatory Visit (INDEPENDENT_AMBULATORY_CARE_PROVIDER_SITE_OTHER): Payer: 59 | Admitting: Family

## 2021-10-23 ENCOUNTER — Other Ambulatory Visit: Payer: Self-pay

## 2021-10-23 VITALS — BP 88/62 | HR 89 | Temp 98.6°F | Wt 114.6 lb

## 2021-10-23 DIAGNOSIS — J101 Influenza due to other identified influenza virus with other respiratory manifestations: Secondary | ICD-10-CM | POA: Insufficient documentation

## 2021-10-23 HISTORY — DX: Influenza due to other identified influenza virus with other respiratory manifestations: J10.1

## 2021-10-23 LAB — POCT INFLUENZA A/B
Influenza A, POC: POSITIVE — AB
Influenza B, POC: POSITIVE — AB

## 2021-10-23 MED ORDER — OSELTAMIVIR PHOSPHATE 75 MG PO CAPS: 75.0000 mg | ORAL_CAPSULE | Freq: Two times a day (BID) | ORAL | 0 refills | Status: DC
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
## 2021-10-23 NOTE — Patient Instructions (Signed)
It was very nice to see you today.  Tamiflu has been sent to your pharmacy, get both doses in today. Wear a mask around family or around others. REST and hydrate with 64oz water daily! Call back if your symptoms are not resolving.  Schedule another f/u appt to discuss your anxiety and medication.   PLEASE NOTE:  If you had any lab tests please let us know if you have not heard back within a few days. You may see your results on MyChart before we have a chance to review them but we will give you a call once they are reviewed by [place]. If we ordered any referrals today, please let us know if you have not heard from their office within the next week.   Please try these tips to maintain a healthy lifestyle:  Eat most of your calories during the day when you are active. Eliminate processed foods including packaged sweets (pies, cakes, cookies), reduce intake of potatoes, white bread, white pasta, and white rice. Look for whole grain options, oat flour or almond flour.  Each meal should contain half fruits/vegetables, one quarter protein, and one quarter carbs (no bigger than a computer mouse).  Cut down on sweet beverages. This includes juice, soda, and sweet tea. Also watch fruit intake, though this is a healthier sweet option, it still contains natural sugar! Limit to 3 servings daily.  Drink at least 1 glass of water with each meal and aim for at least 8 glasses per day  Exercise at least 150 minutes every week.

## 2021-10-26 ENCOUNTER — Encounter: Payer: Self-pay | Admitting: Family

## 2021-11-14 ENCOUNTER — Encounter: Payer: Self-pay | Admitting: Family

## 2021-11-14 ENCOUNTER — Ambulatory Visit (INDEPENDENT_AMBULATORY_CARE_PROVIDER_SITE_OTHER): Payer: 59 | Admitting: Family

## 2021-11-14 ENCOUNTER — Other Ambulatory Visit: Payer: Self-pay

## 2021-11-14 VITALS — BP 114/68 | HR 72 | Temp 98.1°F | Ht 65.0 in | Wt 114.2 lb

## 2021-11-14 DIAGNOSIS — F32A Depression, unspecified: Secondary | ICD-10-CM | POA: Diagnosis not present

## 2021-11-14 DIAGNOSIS — Z0001 Encounter for general adult medical examination with abnormal findings: Secondary | ICD-10-CM

## 2021-11-14 DIAGNOSIS — Z0000 Encounter for general adult medical examination without abnormal findings: Secondary | ICD-10-CM | POA: Diagnosis not present

## 2021-11-14 DIAGNOSIS — F419 Anxiety disorder, unspecified: Secondary | ICD-10-CM | POA: Diagnosis not present

## 2021-11-14 DIAGNOSIS — Z862 Personal history of diseases of the blood and blood-forming organs and certain disorders involving the immune mechanism: Secondary | ICD-10-CM

## 2021-11-14 LAB — LIPID PANEL
Cholesterol: 175 mg/dL (ref 0–200)
HDL: 77.8 mg/dL (ref >39.00)
LDL Cholesterol: 85 mg/dL (ref 0–99)
NonHDL: 97.47
Total CHOL/HDL Ratio: 2
Triglycerides: 63 mg/dL (ref 0.0–149.0)
VLDL: 12.6 mg/dL (ref 0.0–40.0)

## 2021-11-14 LAB — COMPREHENSIVE METABOLIC PANEL
ALT: 10 U/L (ref 0–35)
AST: 15 U/L (ref 0–37)
Albumin: 4.7 g/dL (ref 3.5–5.2)
Alkaline Phosphatase: 24 U/L — ABNORMAL LOW (ref 39–117)
BUN: 10 mg/dL (ref 6–23)
CO2: 28 mEq/L (ref 19–32)
Calcium: 9.4 mg/dL (ref 8.4–10.5)
Chloride: 102 mEq/L (ref 96–112)
Creatinine, Ser: 0.69 mg/dL (ref 0.40–1.20)
GFR: 118.19 mL/min (ref >60.00)
Glucose, Bld: 82 mg/dL (ref 70–99)
Potassium: 3.6 mEq/L (ref 3.5–5.1)
Sodium: 137 mEq/L (ref 135–145)
Total Bilirubin: 0.6 mg/dL (ref 0.2–1.2)
Total Protein: 7.1 g/dL (ref 6.0–8.3)

## 2021-11-14 LAB — CBC WITH DIFFERENTIAL/PLATELET
Basophils Absolute: 0 10*3/uL (ref 0.0–0.1)
Basophils Relative: 0.5 % (ref 0.0–3.0)
Eosinophils Absolute: 0.1 10*3/uL (ref 0.0–0.7)
Eosinophils Relative: 1.7 % (ref 0.0–5.0)
HCT: 37.5 % (ref 36.0–46.0)
Hemoglobin: 12.9 g/dL (ref 12.0–15.0)
Lymphocytes Relative: 31.3 % (ref 12.0–46.0)
Lymphs Abs: 1.6 10*3/uL (ref 0.7–4.0)
MCHC: 34.4 g/dL (ref 30.0–36.0)
MCV: 90.1 fl (ref 78.0–100.0)
Monocytes Absolute: 0.5 10*3/uL (ref 0.1–1.0)
Monocytes Relative: 10.6 % (ref 3.0–12.0)
Neutro Abs: 2.8 10*3/uL (ref 1.4–7.7)
Neutrophils Relative %: 55.9 % (ref 43.0–77.0)
Platelets: 244 10*3/uL (ref 150.0–400.0)
RBC: 4.17 Mil/uL (ref 3.87–5.11)
RDW: 13 % (ref 11.5–15.5)
WBC: 5 10*3/uL (ref 4.0–10.5)

## 2021-11-14 LAB — VITAMIN B12: Vitamin B-12: 460 pg/mL (ref 211–911)

## 2021-11-14 LAB — TSH: TSH: 1.09 u[IU]/mL (ref 0.35–5.50)

## 2021-11-14 MED ORDER — BUSPIRONE HCL 5 MG PO TABS: 5.0000 mg | ORAL_TABLET | Freq: Two times a day (BID) | ORAL | 0 refills | Status: DC
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
## 2021-11-14 NOTE — Assessment & Plan Note (Signed)
Pt reports not tolerating Zoloft, restarted Lexapro on her own, states her depression is better but not her anxiety, reports cutting back to 1 cup of coffee in am. Sending Buspirone for her to try, advised on use & SE.

## 2021-11-14 NOTE — Patient Instructions (Signed)
It was very nice to see you today!  I have sent the new medication, Buspirone to your pharmacy to help with your anxiety. Please schedule a 1 month follow up and we can do your PAP smear at that time as well.  Have a wonderful holiday!   PLEASE NOTE:  If you had any lab tests please let us know if you have not heard back within a few days. You may see your results on MyChart before we have a chance to review them but we will give you a call once they are reviewed by [place]. If we ordered any referrals today, please let us know if you have not heard from their office within the next week.   Please try these tips to maintain a healthy lifestyle:  Eat most of your calories during the day when you are active. Eliminate processed foods including packaged sweets (pies, cakes, cookies), reduce intake of potatoes, white bread, white pasta, and white rice. Look for whole grain options, oat flour or almond flour.  Each meal should contain half fruits/vegetables, one quarter protein, and one quarter carbs (no bigger than a computer mouse).  Cut down on sweet beverages. This includes juice, soda, and sweet tea. Also watch fruit intake, though this is a healthier sweet option, it still contains natural sugar! Limit to 3 servings daily.  Drink at least 1 glass of water with each meal and aim for at least 8 glasses per day  Exercise at least 150 minutes every week.

## 2021-11-15 LAB — HIV ANTIBODY (ROUTINE TESTING W REFLEX): HIV 1&2 Ab, 4th Generation: NONREACTIVE

## 2021-11-17 NOTE — Progress Notes (Signed)
All labs look good!  Keep up the good work with controlling your diet and continue to try and shoot for 30 minutes of exercise daily!

## 2021-12-09 ENCOUNTER — Encounter: Payer: Self-pay | Admitting: Family

## 2021-12-16 ENCOUNTER — Ambulatory Visit: Payer: 59 | Admitting: Family

## 2021-12-24 ENCOUNTER — Encounter: Payer: Self-pay | Admitting: Family

## 2021-12-24 ENCOUNTER — Other Ambulatory Visit: Payer: Self-pay

## 2021-12-24 ENCOUNTER — Other Ambulatory Visit (HOSPITAL_COMMUNITY): Admission: RE | Admit: 2021-12-24 | Payer: Managed Care, Other (non HMO) | Source: Ambulatory Visit

## 2021-12-24 ENCOUNTER — Ambulatory Visit (INDEPENDENT_AMBULATORY_CARE_PROVIDER_SITE_OTHER): Payer: Managed Care, Other (non HMO) | Admitting: Family

## 2021-12-24 VITALS — BP 114/78 | HR 81 | Temp 98.0°F | Ht 65.0 in | Wt 117.2 lb

## 2021-12-24 DIAGNOSIS — F419 Anxiety disorder, unspecified: Secondary | ICD-10-CM

## 2021-12-24 DIAGNOSIS — F32A Depression, unspecified: Secondary | ICD-10-CM | POA: Diagnosis not present

## 2021-12-24 DIAGNOSIS — Z124 Encounter for screening for malignant neoplasm of cervix: Secondary | ICD-10-CM | POA: Diagnosis not present

## 2021-12-24 DIAGNOSIS — M5416 Radiculopathy, lumbar region: Secondary | ICD-10-CM

## 2021-12-24 HISTORY — DX: Radiculopathy, lumbar region: M54.16

## 2021-12-24 MED ORDER — HYDROXYZINE HCL 10 MG PO TABS: 10.0000 mg | ORAL_TABLET | Freq: Three times a day (TID) | ORAL | 0 refills | Status: DC | PRN

## 2021-12-24 NOTE — Assessment & Plan Note (Signed)
did not tolerate Buspar, made her angry, irritable. will try Hydroxyzine, advised on use & SE.

## 2021-12-24 NOTE — Assessment & Plan Note (Signed)
reports burning pain, no tingling or numbness, has been using heating pad with mild relief, Ibuprofen does not help, does not want any muscle relaxers. advised on continued heat and provided handout on exercises to try when she is not having a flare, to help prevent future episodes.
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
## 2021-12-24 NOTE — Patient Instructions (Addendum)
It was very nice to see you today!  I've sent the Hydroxyzine for anxiety to your pharmacy. Continue the Lexapro daily.  For your low back pain that radiates down your leg, continue using the heat up to 30 minutes, 3 times per day.  I have attached exercises for you to try when it starts feeling a little better to prevent future episodes.  Your PAP smear results should be back in 2-3 days.

## 2021-12-27 ENCOUNTER — Other Ambulatory Visit: Payer: Self-pay

## 2021-12-27 ENCOUNTER — Other Ambulatory Visit: Payer: Self-pay | Admitting: Family

## 2021-12-30 LAB — CYTOLOGY - PAP: Adequacy: ABNORMAL

## 2022-01-01 NOTE — Progress Notes (Signed)
unfortunately, the PAP smear specimen was not good enough for testing, sorry! recommend repeating at GYN office, as I had a hard time seeing your cervix and they are better equipped! Is there someone you prefer?

## 2022-03-20 DIAGNOSIS — R5383 Other fatigue: Secondary | ICD-10-CM | POA: Diagnosis not present

## 2022-03-20 DIAGNOSIS — E559 Vitamin D deficiency, unspecified: Secondary | ICD-10-CM | POA: Diagnosis not present

## 2022-03-20 DIAGNOSIS — E663 Overweight: Secondary | ICD-10-CM | POA: Diagnosis not present

## 2022-03-24 ENCOUNTER — Ambulatory Visit: Payer: Managed Care, Other (non HMO) | Admitting: Family

## 2022-03-31 NOTE — Progress Notes (Signed)
? ? ?  GYNECOLOGY CLINIC PROGRESS NOTE ? ?Subjective:  ?  ? Karen Holden is a 31 y.o. G1P0woman who comes in today for a  pap smear only. Her most recent annual exam was on 12/24/2021, performed by her PCP. Her most recent Pap smear was on 1/17/20203 and showed  unsatisfactory results, so patient decided to have pap smear performed at GYN office  . Previous abnormal Pap smears: no.  ? ?The following portions of the patient's history were reviewed and updated as appropriate:  ? ?She  has a past medical history of Allergy, Anxiety, Chronic constipation, Depression, Frequent headaches, Heart palpitations, History of fainting, and History of urinary tract infection. ?She  has a past surgical history that includes Fracture surgery (2011). ? ?Her family history includes Alcohol abuse in her father, maternal grandfather, and paternal grandfather; Arthritis in her maternal grandfather and mother; COPD in her paternal grandmother; Cancer in her mother and paternal grandfather; Depression in her mother; Drug abuse in her father; Early death in her maternal grandmother; Heart attack in her paternal grandmother; Heart disease in her maternal grandfather and paternal grandmother; Hyperlipidemia in her maternal grandmother, mother, and paternal grandmother; Hypertension in her maternal grandmother and paternal grandmother; Stroke in her maternal grandfather, paternal grandfather, and paternal grandmother. ? ?She  reports that she has quit smoking. Her smoking use included cigarettes. She has never used smokeless tobacco. She reports current drug use. Drug: Marijuana. She reports that she does not drink alcohol. ? ? ?Current Outpatient Medications on File Prior to Visit  ?Medication Sig Dispense Refill  ? cetirizine (ZYRTEC) 10 MG tablet Take 10 mg by mouth daily.    ? escitalopram (LEXAPRO) 10 MG tablet TAKE 1 TABLET BY MOUTH EVERY DAY 90 tablet 0  ? ?No current facility-administered medications on file prior to visit.  ? ?She is  allergic to imitrex [sumatriptan].. ? ?Review of Systems ?A comprehensive review of systems was negative except for: Genitourinary: positive for heavy and painful menstrual cycles. Currently using Ibuprofen for relief.   ? ?Objective:  ? ? BP 113/81   Pulse 78   Resp 16   Ht '5\' 5"'$  (1.651 m)   Wt 112 lb 6.4 oz (51 kg)   LMP 03/29/2022 (Exact Date)   BMI 18.70 kg/m?  ?Pelvic Exam: cervix normal in appearance, external genitalia normal, and vagina normal without discharge. Pap smear obtained.  ? ?Assessment:  ? ?1. Encounter for repeat Papanicolaou smear of cervix due to previous unsatisfactory results   ?2. Menorrhagia with regular cycle   ?  ? ?Plan:  ? ?Pap smear performed today. Follow up in 3 years for next pap smear, or as indicated by Pap results. ?Discussed options for management of menorrhagia, patient declines hormonal entities as she notes they affect her mood and don't help her bleeding. Discussed option of use of tranexamic acid. Patient ok to try. Will send 3 month trial. If helpful, patient can request refill.  ? ?Rubie Maid, MD ?[facility] ? ?

## 2022-04-01 ENCOUNTER — Ambulatory Visit: Payer: Managed Care, Other (non HMO) | Admitting: Obstetrics and Gynecology

## 2022-04-01 ENCOUNTER — Encounter: Payer: Self-pay | Admitting: Obstetrics and Gynecology

## 2022-04-01 ENCOUNTER — Other Ambulatory Visit (HOSPITAL_COMMUNITY)
Admission: RE | Admit: 2022-04-01 | Discharge: 2022-04-01 | Disposition: A | Discharge status: Home / Self Care | Payer: Managed Care, Other (non HMO) | Source: Ambulatory Visit | Attending: Obstetrics and Gynecology | Admitting: Obstetrics and Gynecology

## 2022-04-01 VITALS — BP 113/81 | HR 78 | Resp 16 | Ht 65.0 in | Wt 112.4 lb

## 2022-04-01 DIAGNOSIS — Z01419 Encounter for gynecological examination (general) (routine) without abnormal findings: Secondary | ICD-10-CM

## 2022-04-01 DIAGNOSIS — R87615 Unsatisfactory cytologic smear of cervix: Secondary | ICD-10-CM | POA: Diagnosis present

## 2022-04-01 DIAGNOSIS — Z124 Encounter for screening for malignant neoplasm of cervix: Secondary | ICD-10-CM

## 2022-04-01 DIAGNOSIS — N920 Excessive and frequent menstruation with regular cycle: Secondary | ICD-10-CM | POA: Diagnosis not present

## 2022-04-01 MED ORDER — TRANEXAMIC ACID 650 MG PO TABS: 1300.0000 mg | ORAL_TABLET | Freq: Three times a day (TID) | ORAL | 2 refills | Status: DC

## 2022-04-04 LAB — CYTOLOGY - PAP
Diagnosis: NEGATIVE
Diagnosis: REACTIVE

## 2022-04-21 DIAGNOSIS — R5383 Other fatigue: Secondary | ICD-10-CM | POA: Diagnosis not present

## 2022-06-11 ENCOUNTER — Ambulatory Visit: Payer: Commercial Managed Care - HMO | Admitting: Dermatology

## 2022-06-11 DIAGNOSIS — D2239 Melanocytic nevi of other parts of face: Secondary | ICD-10-CM

## 2022-06-11 DIAGNOSIS — D2372 Other benign neoplasm of skin of left lower limb, including hip: Secondary | ICD-10-CM | POA: Diagnosis not present

## 2022-06-11 DIAGNOSIS — D2361 Other benign neoplasm of skin of right upper limb, including shoulder: Secondary | ICD-10-CM | POA: Diagnosis not present

## 2022-06-11 DIAGNOSIS — L309 Dermatitis, unspecified: Secondary | ICD-10-CM | POA: Diagnosis not present

## 2022-06-11 DIAGNOSIS — D225 Melanocytic nevi of trunk: Secondary | ICD-10-CM | POA: Diagnosis not present

## 2022-06-11 DIAGNOSIS — D485 Neoplasm of uncertain behavior of skin: Secondary | ICD-10-CM

## 2022-06-11 MED ORDER — PIMECROLIMUS 1 % EX CREA: TOPICAL_CREAM | CUTANEOUS | 1 refills | Status: DC

## 2022-06-11 NOTE — Patient Instructions (Addendum)
Start pimecrolimus cream - Apply to affected areas legs and underarms twice daily until itch improved.   Gentle Skin Care Guide  1. Bathe no more than once a day.  2. Avoid bathing in hot water  3. Use a mild soap like Dove, Vanicream, Cetaphil, CeraVe. Can use Lever 2000 or Cetaphil antibacterial soap  4. Use soap only where you need it. On most days, use it under your arms, between your legs, and on your feet. Let the water rinse other areas unless visibly dirty.  5. When you get out of the bath/shower, use a towel to gently blot your skin dry, don't rub it.  6. While your skin is still a little damp, apply a moisturizing cream such as Vanicream, CeraVe, Cetaphil, Eucerin, Sarna lotion or plain Vaseline Jelly. For hands apply Neutrogena [place] Hand Cream or Excipial Hand Cream.  7. Reapply moisturizer any time you start to itch or feel dry.  8. Sometimes using free and clear laundry detergents can be helpful. Fabric softener sheets should be avoided. Downy Free & Gentle liquid, or any liquid fabric softener that is free of dyes and perfumes, it acceptable to use  9. If your doctor has given you prescription creams you may apply moisturizers over them   Wound Care Instructions  Cleanse wound gently with soap and water once a day then pat dry with clean gauze. Apply a thing coat of Petrolatum (petroleum jelly, "Vaseline") over the wound (unless you have an allergy to this). We recommend that you use a new, sterile tube of Vaseline. Do not pick or remove scabs. Do not remove the yellow or white "healing tissue" from the base of the wound.  Cover the wound with fresh, clean, nonstick gauze and secure with paper tape. You may use Band-Aids in place of gauze and tape if the would is small enough, but would recommend trimming much of the tape off as there is often too much. Sometimes Band-Aids can irritate the skin.  You should call the office for your biopsy report after 1 week if you have  not already been contacted.  If you experience any problems, such as abnormal amounts of bleeding, swelling, significant bruising, significant pain, or evidence of infection, please call the office immediately.  FOR ADULT SURGERY PATIENTS: If you need something for pain relief you may take 1 extra strength Tylenol (acetaminophen) AND 2 Ibuprofen ('200mg'$  each) together every 4 hours as needed for pain. (do not take these if you are allergic to them or if you have a reason you should not take them.) Typically, you may only need pain medication for 1 to 3 days.    Due to recent changes in healthcare laws, you may see results of your pathology and/or laboratory studies on MyChart before the doctors have had a chance to review them. We understand that in some cases there may be results that are confusing or concerning to you. Please understand that not all results are received at the same time and often the doctors may need to interpret multiple results in order to provide you with the best plan of care or course of treatment. Therefore, we ask that you please give [place] 2 business days to thoroughly review all your results before contacting the office for clarification. Should we see a critical lab result, you will be contacted sooner.   If You Need Anything After Your Visit  If you have any questions or concerns for your doctor, please call our main line at  [phone] and press option 4 to reach your doctor's medical assistant. If no one answers, please leave a voicemail as directed and we will return your call as soon as possible. Messages left after 4 pm will be answered the following business day.   You may also send [place] a message via [place]. We typically respond to MyChart messages within 1-2 business days.  For prescription refills, please ask your pharmacy to contact our office. Our fax number is [phone].  If you have an urgent issue when the clinic is closed that cannot wait until the next  business day, you can page your doctor at the number below.    Please note that while we do our best to be available for urgent issues outside of office hours, we are not available 24/7.   If you have an urgent issue and are unable to reach [place], you may choose to seek medical care at your doctor's office, retail clinic, urgent care center, or emergency room.  If you have a medical emergency, please immediately call 911 or go to the emergency department.  Pager Numbers  - Dr. Nehemiah Massed: [phone]  - Dr. Laurence Ferrari: [phone]  - Dr. Nicole Kindred: [phone]  In the event of inclement weather, please call our main line at [phone] for an update on the status of any delays or closures.  Dermatology Medication Tips: Please keep the boxes that topical medications come in in order to help keep track of the instructions about where and how to use these. Pharmacies typically print the medication instructions only on the boxes and not directly on the medication tubes.   If your medication is too expensive, please contact our office at [phone] option 4 or send [place] a message through [place].   We are unable to tell what your co-pay for medications will be in advance as this is different depending on your insurance coverage. However, we may be able to find a substitute medication at lower cost or fill out paperwork to get insurance to cover a needed medication.   If a prior authorization is required to get your medication covered by your insurance company, please allow [place] 1-2 business days to complete this process.  Drug prices often vary depending on where the prescription is filled and some pharmacies may offer cheaper prices.  The website www.goodrx.com contains coupons for medications through different pharmacies. The prices here do not account for what the cost may be with help from insurance (it may be cheaper with your insurance), but the website can give you the price if you did not use  any insurance.  - You can print the associated coupon and take it with your prescription to the pharmacy.  - You may also stop by our office during regular business hours and pick up a GoodRx coupon card.  - If you need your prescription sent electronically to a different pharmacy, notify our office through [facility] or by phone at [phone] option 4.     Si Usted Necesita Algo Despus de Su Visita  Tambin puede enviarnos un mensaje a travs de Pharmacist, community. Por lo general respondemos a los mensajes de MyChart en el transcurso de 1 a 2 das hbiles.  Para renovar recetas, por favor pida a su farmacia que se ponga en contacto con nuestra oficina. Harland Dingwall de fax es [place] [phone].  Si tiene un asunto urgente cuando la clnica est cerrada y que no puede esperar hasta el siguiente da hbil, puede llamar/localizar a su doctor(a)  al nmero que aparece a continuacin.   Por favor, tenga en cuenta que aunque hacemos todo lo posible para estar disponibles para asuntos urgentes fuera del horario de [place], no estamos disponibles las 24 horas del da, los 7 das de la [place].   Si tiene un problema urgente y no puede comunicarse con nosotros, puede optar por buscar atencin mdica  en el consultorio de su doctor(a), en una clnica privada, en un centro de atencin urgente o en una sala de emergencias.  Si tiene Engineering geologist, por favor llame inmediatamente al 911 o vaya a la sala de emergencias.  Nmeros de bper  - Dr. Nehemiah Massed: [phone]  - Dra. Moye: [phone]  - Dra. Nicole Kindred: [phone]  En caso de inclemencias del [place], por favor llame a Johnsie Kindred principal al [phone] para una actualizacin sobre el [place] de cualquier retraso o cierre.  Consejos para la medicacin en dermatologa: Por favor, guarde las cajas en las que vienen los medicamentos de uso tpico para ayudarle a seguir las instrucciones sobre dnde y cmo usarlos. Las farmacias  generalmente imprimen las instrucciones del medicamento slo en las cajas y no directamente en los tubos del [place].   Si su medicamento es muy caro, por favor, pngase en contacto con Zigmund Daniel llamando al [phone] y presione la opcin 4 o envenos un mensaje a travs de Pharmacist, community.   No podemos decirle cul ser su copago por los medicamentos por adelantado ya que esto es diferente dependiendo de la cobertura de su seguro. Sin embargo, es posible que podamos encontrar un medicamento sustituto a Electrical engineer un formulario para que el seguro cubra el medicamento que se considera necesario.   Si se requiere una autorizacin previa para que su compaa de seguros [place] su medicamento, por favor permtanos de 1 a 2 das hbiles para completar este proceso.  Los precios de los medicamentos varan con frecuencia dependiendo del Environmental consultant de dnde se surte la receta y alguna farmacias pueden ofrecer precios ms baratos.  El sitio web www.goodrx.com tiene cupones para medicamentos de Airline pilot. Los precios aqu no tienen en cuenta lo que podra costar con la ayuda del seguro (puede ser ms barato con su seguro), pero el sitio web puede darle el precio si no utiliz Research scientist (physical sciences).  - Puede imprimir el cupn correspondiente y llevarlo con su receta a la farmacia.  - Tambin puede pasar por nuestra oficina durante el horario de atencin regular y Charity fundraiser una tarjeta de cupones de GoodRx.  - Si necesita que su receta se enve electrnicamente a una farmacia diferente, informe a nuestra oficina a travs de MyChart de [place] o por telfono llamando al [phone] y presione la opcin 4.

## 2022-06-11 NOTE — Progress Notes (Signed)
   New Patient Visit  Subjective  Karen Holden is a 31 y.o. female who presents for the following: New Patient (Initial Visit).  Patient presents for growths of the right upper inner arm (bothersome) and left medial thigh x years. She also has a few moles she would like checked, no changes noticed. She also has itching for years of the lower legs and axilla, not related to shaving or deodorant. Doesn't get rash. She uses Cetaphil and Nivea moisturizer and Dove soap.  No h/o eczema.  Referral from [facility]  Patient accompanied by children.  The following portions of the chart were reviewed this encounter and updated as appropriate:       Review of Systems:  No other skin or systemic complaints except as noted in HPI or Assessment and Plan.  Objective  Well appearing patient in no apparent distress; mood and affect are within normal limits.  A focused examination was performed including arms, legs, face, trunk. Relevant physical exam findings are noted in the Assessment and Plan.  Right upper inner arm 4.0 mm firm pink brown nodule  pretibia, axilla Follicular prominence of the bilateral axilla and bilateral pretibia    Assessment & Plan  Melanocytic Nevi - Tan-brown and/or pink-flesh-colored symmetric macules and papules, including back, right cheek - Benign appearing on exam today - Observation - Call clinic for new or changing moles - Recommend daily use of broad spectrum spf 30+ sunscreen to sun-exposed areas.   Dermatofibroma - Firm pink/brown papulenodule with dimple sign of the left medial thigh - Benign appearing - Call for any changes  Neoplasm of uncertain behavior of skin Right upper inner arm  Epidermal / dermal shaving  Lesion diameter (cm):  0.6 Informed consent: discussed and consent obtained   Patient was prepped and draped in usual sterile fashion: Area prepped with alcohol. Anesthesia: the lesion was anesthetized in a standard fashion    Anesthetic:  1% lidocaine w/ epinephrine 1-100,000 buffered w/ 8.4% NaHCO3 Instrument used: flexible razor blade   Hemostasis achieved with: pressure, aluminum chloride and electrodesiccation   Outcome: patient tolerated procedure well   Post-procedure details: wound care instructions given   Post-procedure details comment:  Ointment and small bandage applied  Specimen 1 - Surgical pathology Differential Diagnosis: Irritated Dermatofibroma vs other Check Margins: No 4.0 mm firm pink brown nodule  Eczema, unspecified type pretibia, axilla  Mild, with chronic pruritus  Start pimecrolimus cream Apply to AA legs, axilla BID until improved dsp 60g 1Rf.  Recommend mild soap and moisturizing cream 1-2 times daily.  Gentle skin care handout provided.   Continue Dove soap with shaving Recommend Vanicream deodorant/antiperspirant   pimecrolimus (ELIDEL) 1 % cream - pretibia, axilla Apply to affected areas legs and underarms twice daily until improved.   Return if symptoms worsen or fail to improve.  IJamesetta Orleans, CMA, am acting as scribe for Brendolyn Patty, MD .  Documentation: I have reviewed the above documentation for accuracy and completeness, and I agree with the above.  Brendolyn Patty MD

## 2022-06-12 ENCOUNTER — Telehealth: Payer: Self-pay

## 2022-06-12 NOTE — Telephone Encounter (Signed)
-----   Message from Brendolyn Patty, MD sent at 06/12/2022  3:02 PM EDT ----- Skin , right upper inner arm DERMATOFIBROMA, IRRITATED  Benign - please call patient

## 2022-06-12 NOTE — Telephone Encounter (Signed)
Advised pt of bx result/sh ?

## 2022-10-28 ENCOUNTER — Encounter: Payer: Self-pay | Admitting: Obstetrics and Gynecology

## 2022-10-29 ENCOUNTER — Inpatient Hospital Stay (HOSPITAL_COMMUNITY): Payer: Medicaid Other

## 2022-10-29 ENCOUNTER — Encounter: Payer: Self-pay | Admitting: Obstetrics and Gynecology

## 2022-10-29 ENCOUNTER — Other Ambulatory Visit: Payer: Self-pay

## 2022-10-29 ENCOUNTER — Encounter (HOSPITAL_COMMUNITY): Payer: Self-pay

## 2022-10-29 ENCOUNTER — Inpatient Hospital Stay (HOSPITAL_COMMUNITY)
Admission: AD | Admit: 2022-10-29 | Discharge: 2022-10-29 | Disposition: A | Discharge status: Home / Self Care | Payer: Medicaid Other | Attending: Obstetrics and Gynecology | Admitting: Obstetrics and Gynecology

## 2022-10-29 DIAGNOSIS — F32A Depression, unspecified: Secondary | ICD-10-CM | POA: Diagnosis not present

## 2022-10-29 DIAGNOSIS — O26891 Other specified pregnancy related conditions, first trimester: Secondary | ICD-10-CM | POA: Insufficient documentation

## 2022-10-29 DIAGNOSIS — O209 Hemorrhage in early pregnancy, unspecified: Secondary | ICD-10-CM | POA: Diagnosis not present

## 2022-10-29 DIAGNOSIS — F419 Anxiety disorder, unspecified: Secondary | ICD-10-CM | POA: Insufficient documentation

## 2022-10-29 DIAGNOSIS — Z79899 Other long term (current) drug therapy: Secondary | ICD-10-CM | POA: Diagnosis not present

## 2022-10-29 DIAGNOSIS — Z3A01 Less than 8 weeks gestation of pregnancy: Secondary | ICD-10-CM

## 2022-10-29 DIAGNOSIS — O99341 Other mental disorders complicating pregnancy, first trimester: Secondary | ICD-10-CM | POA: Diagnosis not present

## 2022-10-29 DIAGNOSIS — R109 Unspecified abdominal pain: Secondary | ICD-10-CM | POA: Insufficient documentation

## 2022-10-29 DIAGNOSIS — Z3490 Encounter for supervision of normal pregnancy, unspecified, unspecified trimester: Secondary | ICD-10-CM

## 2022-10-29 DIAGNOSIS — Z3201 Encounter for pregnancy test, result positive: Secondary | ICD-10-CM | POA: Diagnosis not present

## 2022-10-29 DIAGNOSIS — O26899 Other specified pregnancy related conditions, unspecified trimester: Secondary | ICD-10-CM

## 2022-10-29 LAB — URINALYSIS, ROUTINE W REFLEX MICROSCOPIC
Bilirubin Urine: NEGATIVE
Glucose, UA: NEGATIVE mg/dL
Ketones, ur: NEGATIVE mg/dL
Leukocytes,Ua: NEGATIVE
Nitrite: NEGATIVE
Protein, ur: NEGATIVE mg/dL
Specific Gravity, Urine: 1.008 (ref 1.005–1.030)
pH: 7 (ref 5.0–8.0)

## 2022-10-29 LAB — CBC
HCT: 37.5 % (ref 36.0–46.0)
Hemoglobin: 13.5 g/dL (ref 12.0–15.0)
MCH: 31.5 pg (ref 26.0–34.0)
MCHC: 36 g/dL (ref 30.0–36.0)
MCV: 87.6 fL (ref 80.0–100.0)
Platelets: 268 10*3/uL (ref 150–400)
RBC: 4.28 MIL/uL (ref 3.87–5.11)
RDW: 11.9 % (ref 11.5–15.5)
WBC: 5.1 10*3/uL (ref 4.0–10.5)
nRBC: 0 % (ref 0.0–0.2)

## 2022-10-29 LAB — WET PREP, GENITAL
Clue Cells Wet Prep HPF POC: NONE SEEN
Sperm: NONE SEEN
Trich, Wet Prep: NONE SEEN
WBC, Wet Prep HPF POC: <10 (ref <10)
Yeast Wet Prep HPF POC: NONE SEEN

## 2022-10-29 LAB — HCG, QUANTITATIVE, PREGNANCY: hCG, Beta Chain, Quant, S: 16961 m[IU]/mL — ABNORMAL HIGH (ref <5)

## 2022-10-29 LAB — HIV ANTIBODY (ROUTINE TESTING W REFLEX): HIV Screen 4th Generation wRfx: NONREACTIVE

## 2022-10-29 LAB — RPR: RPR Ser Ql: NONREACTIVE

## 2022-10-29 LAB — POCT PREGNANCY, URINE: Preg Test, Ur: POSITIVE — AB

## 2022-10-29 NOTE — Discharge Instructions (Signed)
Return to MAU: If you have heavier bleeding that soaks through more that 2 pads per hour for an hour or more If you bleed so much that you feel like you might pass out or you do pass out If you have significant abdominal pain that is not improved with Tylenol 1000 mg every 8 hours as needed for pain If you develop a fever > 100.5  
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
## 2022-10-29 NOTE — MAU Note (Signed)
Karen Holden is a 31 y.o. at 28w6dhere in MAU reporting: started noticing brown discharge yesterday. States it was light. Today at 0600 when she went the bathroom it was pink and red with some clots. States it was dripping in the toilet. No bleeding when using the bathroom in MAU. Was having some left sided abdominal pain around 8 but pain has subsided. No recent IC.  LMP: 09/11/2022  Onset of complaint: yesterday  Pain score: 0/10  Vitals:   10/29/22 0920  BP: 97/64  Pulse: 81  Resp: 16  Temp: 98.3 F (36.8 C)  SpO2: 100%     FHT:NA  Lab orders placed from triage: ua, upt

## 2022-10-30 ENCOUNTER — Other Ambulatory Visit: Payer: Self-pay

## 2022-10-30 ENCOUNTER — Emergency Department
Admission: EM | Admit: 2022-10-30 | Discharge: 2022-10-30 | Disposition: A | Discharge status: Home / Self Care | Payer: Medicaid Other | Attending: Emergency Medicine | Admitting: Emergency Medicine

## 2022-10-30 DIAGNOSIS — O209 Hemorrhage in early pregnancy, unspecified: Secondary | ICD-10-CM | POA: Diagnosis not present

## 2022-10-30 DIAGNOSIS — O200 Threatened abortion: Secondary | ICD-10-CM | POA: Insufficient documentation

## 2022-10-30 DIAGNOSIS — Z3A01 Less than 8 weeks gestation of pregnancy: Secondary | ICD-10-CM | POA: Diagnosis not present

## 2022-10-30 LAB — CBC WITH DIFFERENTIAL/PLATELET
Abs Immature Granulocytes: 0.02 10*3/uL (ref 0.00–0.07)
Basophils Absolute: 0 10*3/uL (ref 0.0–0.1)
Basophils Relative: 1 %
Eosinophils Absolute: 0.1 10*3/uL (ref 0.0–0.5)
Eosinophils Relative: 1 %
HCT: 33.5 % — ABNORMAL LOW (ref 36.0–46.0)
Hemoglobin: 12.2 g/dL (ref 12.0–15.0)
Immature Granulocytes: 0 %
Lymphocytes Relative: 27 %
Lymphs Abs: 1.7 10*3/uL (ref 0.7–4.0)
MCH: 31.4 pg (ref 26.0–34.0)
MCHC: 36.4 g/dL — ABNORMAL HIGH (ref 30.0–36.0)
MCV: 86.1 fL (ref 80.0–100.0)
Monocytes Absolute: 0.7 10*3/uL (ref 0.1–1.0)
Monocytes Relative: 12 %
Neutro Abs: 3.7 10*3/uL (ref 1.7–7.7)
Neutrophils Relative %: 59 %
Platelets: 254 10*3/uL (ref 150–400)
RBC: 3.89 MIL/uL (ref 3.87–5.11)
RDW: 11.9 % (ref 11.5–15.5)
WBC: 6.3 10*3/uL (ref 4.0–10.5)
nRBC: 0 % (ref 0.0–0.2)

## 2022-10-30 LAB — BASIC METABOLIC PANEL
Anion gap: 6 (ref 5–15)
BUN: 11 mg/dL (ref 6–20)
CO2: 24 mmol/L (ref 22–32)
Calcium: 9.5 mg/dL (ref 8.9–10.3)
Chloride: 105 mmol/L (ref 98–111)
Creatinine, Ser: 0.57 mg/dL (ref 0.44–1.00)
GFR, Estimated: >60 mL/min (ref >60)
Glucose, Bld: 95 mg/dL (ref 70–99)
Potassium: 3.7 mmol/L (ref 3.5–5.1)
Sodium: 135 mmol/L (ref 135–145)

## 2022-10-30 LAB — URINALYSIS, ROUTINE W REFLEX MICROSCOPIC
Bacteria, UA: NONE SEEN
Bilirubin Urine: NEGATIVE
Glucose, UA: NEGATIVE mg/dL
Ketones, ur: NEGATIVE mg/dL
Leukocytes,Ua: NEGATIVE
Nitrite: NEGATIVE
Protein, ur: NEGATIVE mg/dL
Specific Gravity, Urine: 1.021 (ref 1.005–1.030)
pH: 6 (ref 5.0–8.0)

## 2022-10-30 LAB — GC/CHLAMYDIA PROBE AMP (~~LOC~~) NOT AT ARMC
Chlamydia: NEGATIVE
Comment: NEGATIVE
Comment: NORMAL
Neisseria Gonorrhea: NEGATIVE

## 2022-10-30 LAB — HCG, QUANTITATIVE, PREGNANCY: hCG, Beta Chain, Quant, S: 14626 m[IU]/mL — ABNORMAL HIGH (ref <5)

## 2022-10-30 LAB — POC URINE PREG, ED: Preg Test, Ur: POSITIVE — AB

## 2022-10-30 LAB — ABO/RH: ABO/RH(D): A POS

## 2022-10-30 NOTE — Discharge Instructions (Signed)
You need to follow up for repeat HCGs to see if they are continuing to go down this is a sign of a miscarriage.  Return for fevers, worsening pain, bleeding more then a pad an hour or any other concerns.

## 2022-10-30 NOTE — ED Triage Notes (Signed)
Pt to ED via POV for vaginal bleeding. Pt states that she had been having bleeding for 3 days and is between 6-[redacted] weeks pregnant. Pt states that she was seen at [facility] yesterday and told that everything was fine but today she started passing clots. Pt is in NAD.

## 2022-10-30 NOTE — ED Provider Notes (Signed)
[facility] Provider Note    Event Date/Time   First MD Initiated Contact with Patient 10/30/22 [phone]     (approximate)   History   Vaginal Bleeding   HPI  Karen Holden is a 31 y.o. female G2, P1 who comes in with concerns for vaginal bleeding.  [facility] and being told everything was normal on her ultrasound.  I reviewed the ultrasound from yesterday where patient had a single live intrauterine gestation at 6 weeks 0 days with a heart rate of 114 without any evidence of subchorionic hemorrhage and right corpus luteum cyst and left ovary unremarkable.  Patient reports that since then she is had a few more additional clots and she was worried therefore came into the emergency room.  Denies it being more than a pad an hour denies any fevers.  States that she just does not feel like anything feels right at this time.   Physical Exam   Triage Vital Signs: ED Triage Vitals [10/30/22 0946]  Enc Vitals Group     BP 111/74     Pulse Rate 81     Resp 16     Temp 98.5 F (36.9 C)     Temp Source Oral     SpO2 (!) 79 %     Weight      Height      Head Circumference      Peak Flow      Pain Score 2     Pain Loc      Pain Edu?      Excl. in [place]?     Most recent vital signs: Vitals:   10/30/22 0948 10/30/22 1147  BP:  114/74  Pulse:  78  Resp:  16  Temp:  98.2 F (36.8 C)  SpO2: 100% 100%     General: Awake, no distress.  CV:  Good peripheral perfusion.  Resp:  Normal effort.  Abd:  No distention.  Soft nontender Other:     ED Results / Procedures / Treatments   Labs (all labs ordered are listed, but only abnormal results are displayed) Labs Reviewed  CBC WITH DIFFERENTIAL/PLATELET - Abnormal; Notable for the following components:      Result Value   HCT 33.5 (*)    MCHC 36.4 (*)    All other components within normal limits  POC URINE PREG, ED - Abnormal; Notable for the  following components:   Preg Test, Ur POSITIVE (*)    All other components within normal limits  BASIC METABOLIC PANEL  HCG, QUANTITATIVE, PREGNANCY  URINALYSIS, ROUTINE W REFLEX MICROSCOPIC  ABO/RH       RADIOLOGY I have reviewed the ultrasound personally and interpreted from yesterday with intrauterine pregnancy   PROCEDURES:  Critical Care performed: No  Procedures   MEDICATIONS ORDERED IN ED: Medications - No data to display   IMPRESSION / MDM / [place] / ED COURSE  I reviewed the triage vital signs and the nursing notes.   Patient's presentation is most consistent with acute presentation with potential threat to life or bodily function.   This is concerning for miscarriage versus threatened miscarriage.  Patient had ultrasound yesterday without evidence of ectopic.  We discussed the utility of repeat ultrasound today and how it may not give [place] all of the answers and that we can first look at her hCG level to see if it is uptrending or downtrending.  Pregnancy test is +  No evidence of any anemia.  Patient is a positive does not need RhoGAM.  Urine without evidence of UTI.  Offered STD screening but she just reportedly had it done so declined  Repeat hCG is downtrending from 16,000-14,000.  Discussed with patient that this is most likely a sign of a miscarriage but it would require  additional hCG level in 1 to 2 days to see if it is continuing to downtrend.  We discussed rare chance of HCG going up but how normal HCG double every 48-72 hours and if continuing to go down that is a sign of miscarriage. We discussed utility of repeat ultrasound now and have opted to hold off and just trend out her hCGs and follow-up with OB/GYN next week.  We discussed return precautions including bleeding more than 1 pad an hour, fevers, worsening pain.  We discussed pain control with Tylenol.  Offered oxycodone but she declined.       FINAL CLINICAL IMPRESSION(S) / ED  DIAGNOSES   Final diagnoses:  Threatened miscarriage     Rx / DC Orders   ED Discharge Orders     None        Note:  This document was prepared using Dragon voice recognition software and may include unintentional dictation errors.   Vanessa Hendron, MD 10/30/22 1300

## 2022-11-03 ENCOUNTER — Other Ambulatory Visit: Payer: Self-pay

## 2022-11-03 ENCOUNTER — Telehealth: Payer: Self-pay

## 2022-11-03 DIAGNOSIS — O039 Complete or unspecified spontaneous abortion without complication: Secondary | ICD-10-CM

## 2022-11-03 NOTE — Telephone Encounter (Signed)
Patient called, she thinks she may have had a miscarriage over the holiday. She was evaluated at ED on 10/30/2022 and a beta was done downtrending from 16,000-14,000. I scheduled her to come in tomorrow for another beta to see if levels continue to downtrend. Please advise.

## 2022-11-04 ENCOUNTER — Other Ambulatory Visit: Payer: Commercial Managed Care - HMO

## 2022-11-05 ENCOUNTER — Other Ambulatory Visit: Payer: Self-pay | Admitting: Obstetrics and Gynecology

## 2022-11-05 DIAGNOSIS — O039 Complete or unspecified spontaneous abortion without complication: Secondary | ICD-10-CM

## 2022-11-05 LAB — BETA HCG QUANT (REF LAB): hCG Quant: 1020 m[IU]/mL

## 2022-11-05 NOTE — Telephone Encounter (Signed)
Contacted patient regarding most recent BHCG levels (trending downward).  Patient notes that she has been passing clots and possibly some tissue. Overall doing ok, bleeding is not significantly heavy and cramping can be controlled with OTC meds as needed.  Discussed f/u in 2-3 weeks after miscarriage for follow up and discussion of future fertility plans.  Will cancel NOB labs and appointment. Will change [place] on 12/21 to GYN instead of OB.   Dr. Marcelline Mates

## 2022-11-07 ENCOUNTER — Ambulatory Visit: Payer: Commercial Managed Care - HMO

## 2022-11-27 ENCOUNTER — Other Ambulatory Visit: Payer: Commercial Managed Care - HMO

## 2022-11-27 ENCOUNTER — Ambulatory Visit: Payer: Commercial Managed Care - HMO

## 2022-11-27 ENCOUNTER — Other Ambulatory Visit: Payer: Self-pay | Admitting: Obstetrics and Gynecology

## 2022-11-27 DIAGNOSIS — O209 Hemorrhage in early pregnancy, unspecified: Secondary | ICD-10-CM

## 2022-11-28 LAB — BETA HCG QUANT (REF LAB): hCG Quant: 2 m[IU]/mL

## 2022-12-03 NOTE — Progress Notes (Deleted)
    GYNECOLOGY PROGRESS NOTE  Subjective:    Patient ID: Karen Holden, female    DOB: 11-29-1991, 29 y.o.   MRN: [id number]  HPI  Patient is a 31 y.o. G33P2002 female who presents for follow up on miscarriage   The following portions of the patient's history were reviewed and updated as appropriate: allergies, current medications, past family history, past medical history, past social history, past surgical history, and problem list.  Review of Systems Pertinent items are noted in HPI.   Objective:   Last menstrual period 09/11/2022, unknown if currently breastfeeding. There is no height or weight on file to calculate BMI. General appearance: alert, cooperative, and no distress Abdomen: {abdominal exam:16834} Pelvic: {pelvic exam:16852::"cervix normal in appearance","external genitalia normal","no adnexal masses or tenderness","no cervical motion tenderness","rectovaginal septum normal","uterus normal size, shape, and consistency","vagina normal without discharge"} Extremities: {extremity exam:5109} Neurologic: {neuro exam:17854}   Assessment:   No diagnosis found.   Plan:   There are no diagnoses linked to this encounter.

## 2022-12-05 ENCOUNTER — Ambulatory Visit: Payer: Commercial Managed Care - HMO | Admitting: Obstetrics and Gynecology

## 2023-01-12 ENCOUNTER — Ambulatory Visit: Payer: Medicaid Other | Admitting: Nurse Practitioner

## 2023-01-16 ENCOUNTER — Other Ambulatory Visit: Payer: Medicaid Other

## 2023-01-16 DIAGNOSIS — Z8349 Family history of other endocrine, nutritional and metabolic diseases: Secondary | ICD-10-CM | POA: Diagnosis not present

## 2023-01-16 DIAGNOSIS — R7301 Impaired fasting glucose: Secondary | ICD-10-CM

## 2023-01-16 DIAGNOSIS — I10 Essential (primary) hypertension: Secondary | ICD-10-CM | POA: Diagnosis not present

## 2023-01-17 LAB — CBC WITH DIFFERENTIAL
Basophils Absolute: 0 10*3/uL (ref 0.0–0.2)
Basos: 1 %
EOS (ABSOLUTE): 0.1 10*3/uL (ref 0.0–0.4)
Eos: 4 %
Hematocrit: 37 % (ref 34.0–46.6)
Hemoglobin: 12.3 g/dL (ref 11.1–15.9)
Immature Grans (Abs): 0 10*3/uL (ref 0.0–0.1)
Immature Granulocytes: 0 %
Lymphocytes Absolute: 1.5 10*3/uL (ref 0.7–3.1)
Lymphs: 39 %
MCH: 30.4 pg (ref 26.6–33.0)
MCHC: 33.2 g/dL (ref 31.5–35.7)
MCV: 92 fL (ref 79–97)
Monocytes Absolute: 0.5 10*3/uL (ref 0.1–0.9)
Monocytes: 12 %
Neutrophils Absolute: 1.7 10*3/uL (ref 1.4–7.0)
Neutrophils: 44 %
RBC: 4.04 x10E6/uL (ref 3.77–5.28)
RDW: 12.2 % (ref 11.7–15.4)
WBC: 3.8 10*3/uL (ref 3.4–10.8)

## 2023-01-17 LAB — CMP14+EGFR
ALT: 5 IU/L (ref 0–32)
AST: 14 IU/L (ref 0–40)
Albumin/Globulin Ratio: 2.4 — ABNORMAL HIGH (ref 1.2–2.2)
Albumin: 4.7 g/dL (ref 4.0–5.0)
Alkaline Phosphatase: 28 IU/L — ABNORMAL LOW (ref 44–121)
BUN/Creatinine Ratio: 13 (ref 9–23)
BUN: 9 mg/dL (ref 6–20)
Bilirubin Total: 0.5 mg/dL (ref 0.0–1.2)
CO2: 24 mmol/L (ref 20–29)
Calcium: 9.6 mg/dL (ref 8.7–10.2)
Chloride: 101 mmol/L (ref 96–106)
Creatinine, Ser: 0.68 mg/dL (ref 0.57–1.00)
Globulin, Total: 2 g/dL (ref 1.5–4.5)
Glucose: 86 mg/dL (ref 70–99)
Potassium: 4.2 mmol/L (ref 3.5–5.2)
Sodium: 140 mmol/L (ref 134–144)
Total Protein: 6.7 g/dL (ref 6.0–8.5)
eGFR: 121 mL/min/{1.73_m2} (ref >59)

## 2023-01-17 LAB — LIPID PANEL
Chol/HDL Ratio: 2.6 ratio (ref 0.0–4.4)
Cholesterol, Total: 196 mg/dL (ref 100–199)
HDL: 76 mg/dL (ref >39)
LDL Chol Calc (NIH): 108 mg/dL — ABNORMAL HIGH (ref 0–99)
Triglycerides: 63 mg/dL (ref 0–149)
VLDL Cholesterol Cal: 12 mg/dL (ref 5–40)

## 2023-01-17 LAB — HEMOGLOBIN A1C
Est. average glucose Bld gHb Est-mCnc: 97 mg/dL
Hgb A1c MFr Bld: 5 % (ref 4.8–5.6)

## 2023-01-17 LAB — TSH: TSH: 1.14 u[IU]/mL (ref 0.450–4.500)

## 2023-01-22 ENCOUNTER — Ambulatory Visit: Payer: Medicaid Other | Admitting: Nurse Practitioner

## 2023-02-03 ENCOUNTER — Encounter: Payer: Self-pay | Admitting: Nurse Practitioner

## 2023-02-03 ENCOUNTER — Ambulatory Visit (INDEPENDENT_AMBULATORY_CARE_PROVIDER_SITE_OTHER): Payer: Medicaid Other | Admitting: Nurse Practitioner

## 2023-02-03 VITALS — BP 128/70 | HR 81 | Ht 65.0 in | Wt 115.0 lb

## 2023-02-03 DIAGNOSIS — F419 Anxiety disorder, unspecified: Secondary | ICD-10-CM | POA: Diagnosis not present

## 2023-02-03 DIAGNOSIS — E782 Mixed hyperlipidemia: Secondary | ICD-10-CM

## 2023-02-03 DIAGNOSIS — F32A Depression, unspecified: Secondary | ICD-10-CM | POA: Diagnosis not present

## 2023-02-03 DIAGNOSIS — O039 Complete or unspecified spontaneous abortion without complication: Secondary | ICD-10-CM | POA: Diagnosis not present

## 2023-02-03 DIAGNOSIS — R238 Other skin changes: Secondary | ICD-10-CM | POA: Diagnosis not present

## 2023-02-03 NOTE — Patient Instructions (Signed)
1) Pt will call [facility] for counselling 2) Will trial venlafaxine for patient 3) Referral for pelvic exam post miscarriage with [facility] in [place] 4) Follow up appt in 4 weeks 5) Low chol diet

## 2023-02-03 NOTE — Progress Notes (Signed)
Established Patient Office Visit  Subjective:  Patient ID: Karen Holden, female    DOB: [birth year]  Age: 32 y.o. MRN: [id number]  Chief Complaint  Patient presents with   Follow-up    Follow Up    Follow up and reviewed labs on patient's phone today as our server is out here at the office.  Her LDL was mildly elevated but all other labs are WNL.  She miscarried recently after 2 months of pregnancy, has not been back to [place] [facility] for pelvic recheck because she did not feel she received proper care there.  She also refers to a sore in her nare.  She also states her anxiety is not controlled, hx of being on buspirone, hydroxyzyine, escitalopram and sertraline without benefit.  She cannot self refer to [facility] as her mom works there as a Therapist, sports.  She will contact [facility] to see if they will accept her insurance.       Past Medical History:  Diagnosis Date   Allergy    Anxiety    Chronic constipation    Depression    Dealing with the death of her best friend   Frequent headaches    Heart palpitations    History of fainting    History of urinary tract infection     Social History   Socioeconomic History   Marital status: Single    Spouse name: Not on file   Number of children: Not on file   Years of education: 12   Highest education level: Not on file  Occupational History   Occupation: Retail    Comment: [employer]  Tobacco Use   Smoking status: Former    Types: Cigarettes   Smokeless tobacco: Never  Substance and Sexual Activity   Alcohol use: No   Drug use: Yes    Types: Marijuana   Sexual activity: Yes  Other Topics Concern   Not on file  Social History Narrative   Dorie grew up in [place]. She is married with 2 children. Ventura works in Scientist, research (medical) at [employer].    Social Determinants of Health   Financial Resource Strain: Not on file  Food Insecurity: Not on file  Transportation Needs: Not on file  Physical Activity: Not on file  Stress:  Not on file  Social Connections: Not on file  Intimate Partner Violence: Not on file    Family History  Problem Relation Age of Onset   Hyperlipidemia Mother    Depression Mother    Arthritis Mother    Cancer Mother         Cervical Cancer    Drug abuse Father    Alcohol abuse Father    Hypertension Maternal Grandmother    Hyperlipidemia Maternal Grandmother    Early death Maternal Grandmother    Stroke Maternal Grandfather    Heart disease Maternal Grandfather    Arthritis Maternal Grandfather    Alcohol abuse Maternal Grandfather    Stroke Paternal Grandmother    Heart disease Paternal Grandmother    Hypertension Paternal Grandmother    Hyperlipidemia Paternal Grandmother    COPD Paternal Grandmother    Heart attack Paternal Grandmother    Stroke Paternal Grandfather    Alcohol abuse Paternal Grandfather    Cancer Paternal Grandfather        Colon Cancer     Allergies  Allergen Reactions   Bee Pollen    Buspar [Buspirone]    Celebrex [Celecoxib]    Imitrex [  Sumatriptan] Other (See Comments)    Chest pain   Sertraline     Review of Systems  Constitutional: Negative.   HENT: Negative.    Eyes: Negative.   Respiratory: Negative.    Cardiovascular: Negative.   Gastrointestinal: Negative.   Genitourinary: Negative.   Musculoskeletal: Negative.   Skin: Negative.   Neurological: Negative.   Endo/Heme/Allergies: Negative.   Psychiatric/Behavioral:  The patient is nervous/anxious.   All other systems reviewed and are negative.      Objective:   BP 128/70   Pulse 81   Ht '5\' 5"'$  (1.651 m)   Wt 115 lb (52.2 kg)   LMP 09/11/2022   SpO2 97%   Breastfeeding Unknown   BMI 19.14 kg/m   Vitals:   02/03/23 1137  BP: 128/70  Pulse: 81  Height: '5\' 5"'$  (1.651 m)  Weight: 115 lb (52.2 kg)  SpO2: 97%  BMI (Calculated): 19.14    Physical Exam Vitals reviewed.  Constitutional:      Appearance: Normal appearance.  HENT:     Head: Normocephalic.     Nose:      Comments: Small papule to left nare    Mouth/Throat:     Mouth: Mucous membranes are moist.  Eyes:     Pupils: Pupils are equal, round, and reactive to light.  Cardiovascular:     Rate and Rhythm: Normal rate and regular rhythm.  Pulmonary:     Effort: Pulmonary effort is normal.     Breath sounds: Normal breath sounds.  Abdominal:     General: Bowel sounds are normal.     Palpations: Abdomen is soft.  Musculoskeletal:        General: Normal range of motion.     Cervical back: Normal range of motion and neck supple.  Skin:    General: Skin is warm and dry.  Neurological:     Mental Status: She is alert and oriented to person, place, and time.  Psychiatric:        Mood and Affect: Mood normal.        Behavior: Behavior normal.      No results found for any visits on 02/03/23.  Recent Results (from the past 2160 hour(s))  Beta hCG quant (ref lab)     Status: None   Collection Time: 11/27/22 11:31 AM  Result Value Ref Range   hCG Quant 2 mIU/mL    Comment:                      Female (Non-pregnant)    0 -     5                             (Postmenopausal)  0 -     8                      Female (Pregnant)                      Weeks of Gestation                              3                6 -    59  4               10 -   750                              5              217 -  7138                              6              158 - [place] [facility] E CLIA methodology   CMP14+EGFR     Status: Abnormal   Collection Time: 01/16/23  9:13 AM  Result Value Ref Range   Glucose 86 70 - 99 mg/dL   BUN 9 6 - 20 mg/dL   Creatinine, Ser 0.68 0.57 - 1.00 mg/dL   eGFR  121 >59 mL/min/1.73   BUN/Creatinine Ratio 13 9 - 23   Sodium 140 134 - 144 mmol/L   Potassium 4.2 3.5 - 5.2 mmol/L   Chloride 101 96 - 106 mmol/L   CO2 24 20 - 29 mmol/L   Calcium 9.6 8.7 - 10.2 mg/dL   Total Protein 6.7 6.0 - 8.5 g/dL   Albumin 4.7 4.0 - 5.0 g/dL   Globulin, Total 2.0 1.5 - 4.5 g/dL   Albumin/Globulin Ratio 2.4 (H) 1.2 - 2.2   Bilirubin Total 0.5 0.0 - 1.2 mg/dL   Alkaline Phosphatase 28 (L) 44 - 121 IU/L   AST 14 0 - 40 IU/L   ALT 5 0 - 32 IU/L  CBC With Differential     Status: None   Collection Time: 01/16/23  9:13 AM  Result Value Ref Range   WBC 3.8 3.4 - 10.8 x10E3/uL   RBC 4.04 3.77 - 5.28 x10E6/uL   Hemoglobin 12.3 11.1 - 15.9 g/dL   Hematocrit 37.0 34.0 - 46.6 %   MCV 92 79 - 97 fL   MCH 30.4 26.6 - 33.0 pg   MCHC 33.2 31.5 - 35.7 g/dL   RDW 12.2 11.7 - 15.4 %   Neutrophils 44 Not Estab. %   Lymphs 39 Not Estab. %   Monocytes 12 Not Estab. %   Eos 4 Not Estab. %   Basos 1 Not Estab. %   Neutrophils Absolute 1.7 1.4 - 7.0 x10E3/uL   Lymphocytes Absolute 1.5 0.7 - 3.1 x10E3/uL   Monocytes Absolute 0.5 0.1 - 0.9 x10E3/uL   EOS (ABSOLUTE) 0.1 0.0 - 0.4 x10E3/uL   Basophils Absolute 0.0 0.0 - 0.2 x10E3/uL   Immature Granulocytes 0 Not Estab. %   Immature Grans (Abs) 0.0 0.0 - 0.1 x10E3/uL  Lipid Profile     Status: Abnormal   Collection Time: 01/16/23  9:13 AM  Result Value Ref Range   Cholesterol, Total 196 100 - 199 mg/dL   Triglycerides 63 0 - 149 mg/dL   HDL 76 >39 mg/dL   VLDL Cholesterol Cal 12 5 - 40 mg/dL   LDL Chol Calc (NIH) 108 (H) 0 - 99 mg/dL   Chol/HDL Ratio 2.6 0.0 - 4.4 ratio    Comment:                                   T. Chol/HDL Ratio                                              Men  Women                               1/2 Avg.Risk  3.4    3.3                                   Avg.Risk  5.0    4.4                                2X Avg.Risk  9.6    7.1  3X Avg.Risk 23.4   11.0   HgB A1c     Status: None   Collection Time: 01/16/23  9:13 AM  Result Value Ref Range   Hgb A1c MFr Bld 5.0 4.8 - 5.6 %    Comment:          Prediabetes: 5.7 - 6.4          Diabetes: >6.4          Glycemic control for adults with diabetes: <7.0    Est. average glucose Bld gHb Est-mCnc 97 mg/dL  TSH     Status: None   Collection Time: 01/16/23  9:13 AM  Result Value Ref Range   TSH 1.140 0.450 - 4.500 uIU/mL      Assessment & Plan:   Problem List Items Addressed This Visit       Musculoskeletal and Integument   Papule of skin     Other   Anxiety and depression - Primary   Miscarriage   Relevant Orders   Ambulatory referral to Obstetrics / Gynecology   Mixed hyperlipidemia    Return in about 4 weeks (around 03/03/2023).   Total time spent: 35 minutes  Evern Bio, NP  02/03/2023

## 2023-02-11 ENCOUNTER — Encounter: Payer: Self-pay | Admitting: Nurse Practitioner

## 2023-02-16 ENCOUNTER — Other Ambulatory Visit: Payer: Self-pay | Admitting: Nurse Practitioner

## 2023-02-16 DIAGNOSIS — F419 Anxiety disorder, unspecified: Secondary | ICD-10-CM

## 2023-02-16 MED ORDER — VENLAFAXINE HCL ER 37.5 MG PO CP24: 37.5000 mg | ORAL_CAPSULE | Freq: Every day | ORAL | 0 refills | Status: DC

## 2023-03-02 ENCOUNTER — Telehealth: Payer: Self-pay

## 2023-03-02 NOTE — Telephone Encounter (Signed)
Patient LM asking for call back 

## 2023-03-03 ENCOUNTER — Emergency Department (HOSPITAL_COMMUNITY)
Admission: EM | Admit: 2023-03-03 | Discharge: 2023-03-03 | Disposition: A | Discharge status: Home / Self Care | Payer: Medicaid Other | Attending: Emergency Medicine | Admitting: Emergency Medicine

## 2023-03-03 ENCOUNTER — Encounter: Payer: Self-pay | Admitting: Nurse Practitioner

## 2023-03-03 ENCOUNTER — Ambulatory Visit (INDEPENDENT_AMBULATORY_CARE_PROVIDER_SITE_OTHER): Payer: Medicaid Other | Admitting: Nurse Practitioner

## 2023-03-03 ENCOUNTER — Encounter (HOSPITAL_COMMUNITY): Payer: Self-pay

## 2023-03-03 ENCOUNTER — Other Ambulatory Visit: Payer: Self-pay

## 2023-03-03 VITALS — BP 102/58 | HR 82 | Ht 65.0 in | Wt 114.8 lb

## 2023-03-03 DIAGNOSIS — J329 Chronic sinusitis, unspecified: Secondary | ICD-10-CM | POA: Insufficient documentation

## 2023-03-03 DIAGNOSIS — J301 Allergic rhinitis due to pollen: Secondary | ICD-10-CM | POA: Insufficient documentation

## 2023-03-03 DIAGNOSIS — R112 Nausea with vomiting, unspecified: Secondary | ICD-10-CM | POA: Diagnosis not present

## 2023-03-03 DIAGNOSIS — R197 Diarrhea, unspecified: Secondary | ICD-10-CM | POA: Insufficient documentation

## 2023-03-03 DIAGNOSIS — I959 Hypotension, unspecified: Secondary | ICD-10-CM | POA: Diagnosis not present

## 2023-03-03 HISTORY — DX: Chronic sinusitis, unspecified: J32.9

## 2023-03-03 HISTORY — DX: Allergic rhinitis due to pollen: J30.1

## 2023-03-03 LAB — CBC WITH DIFFERENTIAL/PLATELET
Abs Immature Granulocytes: 0.05 10*3/uL (ref 0.00–0.07)
Basophils Absolute: 0 10*3/uL (ref 0.0–0.1)
Basophils Relative: 0 %
Eosinophils Absolute: 0 10*3/uL (ref 0.0–0.5)
Eosinophils Relative: 0 %
HCT: 38 % (ref 36.0–46.0)
Hemoglobin: 13.4 g/dL (ref 12.0–15.0)
Immature Granulocytes: 0 %
Lymphocytes Relative: 3 %
Lymphs Abs: 0.3 10*3/uL — ABNORMAL LOW (ref 0.7–4.0)
MCH: 31.1 pg (ref 26.0–34.0)
MCHC: 35.3 g/dL (ref 30.0–36.0)
MCV: 88.2 fL (ref 80.0–100.0)
Monocytes Absolute: 0.5 10*3/uL (ref 0.1–1.0)
Monocytes Relative: 4 %
Neutro Abs: 12 10*3/uL — ABNORMAL HIGH (ref 1.7–7.7)
Neutrophils Relative %: 93 %
Platelets: 245 10*3/uL (ref 150–400)
RBC: 4.31 MIL/uL (ref 3.87–5.11)
RDW: 12.3 % (ref 11.5–15.5)
WBC: 12.9 10*3/uL — ABNORMAL HIGH (ref 4.0–10.5)
nRBC: 0 % (ref 0.0–0.2)

## 2023-03-03 LAB — URINALYSIS, ROUTINE W REFLEX MICROSCOPIC
Bacteria, UA: NONE SEEN
Bilirubin Urine: NEGATIVE
Glucose, UA: NEGATIVE mg/dL
Ketones, ur: 80 mg/dL — AB
Leukocytes,Ua: NEGATIVE
Nitrite: NEGATIVE
Protein, ur: 100 mg/dL — AB
RBC / HPF: >50 RBC/hpf (ref 0–5)
Specific Gravity, Urine: 1.02 (ref 1.005–1.030)
pH: 5 (ref 5.0–8.0)

## 2023-03-03 LAB — COMPREHENSIVE METABOLIC PANEL
ALT: 14 U/L (ref 0–44)
AST: 19 U/L (ref 15–41)
Albumin: 4.8 g/dL (ref 3.5–5.0)
Alkaline Phosphatase: 26 U/L — ABNORMAL LOW (ref 38–126)
Anion gap: 10 (ref 5–15)
BUN: 14 mg/dL (ref 6–20)
CO2: 23 mmol/L (ref 22–32)
Calcium: 9.1 mg/dL (ref 8.9–10.3)
Chloride: 106 mmol/L (ref 98–111)
Creatinine, Ser: 0.73 mg/dL (ref 0.44–1.00)
GFR, Estimated: >60 mL/min (ref >60)
Glucose, Bld: 102 mg/dL — ABNORMAL HIGH (ref 70–99)
Potassium: 3.2 mmol/L — ABNORMAL LOW (ref 3.5–5.1)
Sodium: 139 mmol/L (ref 135–145)
Total Bilirubin: 0.8 mg/dL (ref 0.3–1.2)
Total Protein: 7.3 g/dL (ref 6.5–8.1)

## 2023-03-03 LAB — LIPASE, BLOOD: Lipase: 46 U/L (ref 11–51)

## 2023-03-03 LAB — I-STAT BETA HCG BLOOD, ED (MC, WL, AP ONLY): I-stat hCG, quantitative: <5 m[IU]/mL (ref <5)

## 2023-03-03 MED ORDER — DIPHENHYDRAMINE HCL 50 MG/ML IJ SOLN
12.5000 mg | INTRAMUSCULAR | Status: AC
  Administered 2023-03-03: 12.5 mg via INTRAVENOUS
  Filled 2023-03-03: qty 1

## 2023-03-03 MED ORDER — LACTATED RINGERS IV BOLUS
1000.0000 mL | Freq: Once | INTRAVENOUS | Status: AC
  Administered 2023-03-03: 1000 mL via INTRAVENOUS

## 2023-03-03 MED ORDER — AMOXICILLIN-POT CLAVULANATE 875-125 MG PO TABS: 1.0000 | ORAL_TABLET | Freq: Two times a day (BID) | ORAL | 0 refills | Status: DC

## 2023-03-03 MED ORDER — DROPERIDOL 2.5 MG/ML IJ SOLN
1.2500 mg | Freq: Once | INTRAMUSCULAR | Status: AC
  Administered 2023-03-03: 1.25 mg via INTRAVENOUS
  Filled 2023-03-03: qty 2

## 2023-03-03 MED ORDER — DOXYCYCLINE HYCLATE 100 MG PO CAPS: 100.0000 mg | ORAL_CAPSULE | Freq: Two times a day (BID) | ORAL | 0 refills | Status: AC

## 2023-03-03 MED ORDER — METOCLOPRAMIDE HCL 10 MG PO TABS: 10.0000 mg | ORAL_TABLET | Freq: Four times a day (QID) | ORAL | 0 refills | Status: DC

## 2023-03-03 MED ORDER — ONDANSETRON HCL 4 MG/2ML IJ SOLN
4.0000 mg | Freq: Once | INTRAMUSCULAR | Status: AC
  Administered 2023-03-03: 4 mg via INTRAVENOUS
  Filled 2023-03-03: qty 2

## 2023-03-03 NOTE — ED Notes (Signed)
Patient given discharge instructions and follow up care. Patient verbalized understanding. IV removed with catheter intact. Patient verbalized understanding. Patient ambulatory out of ED with significant other.

## 2023-03-03 NOTE — Addendum Note (Signed)
Addended by: Evern Bio on: 03/03/2023 10:25 AM   Modules accepted: Orders
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
## 2023-03-03 NOTE — Patient Instructions (Signed)
1) Keep using xyzal and nasacort 2) Add on antibiotic 3) Push water 4) Follow up appt in 1 week, cancel if better

## 2023-03-03 NOTE — ED Provider Notes (Signed)
[facility] Provider Note   CSN: [id number] Arrival date & time: 03/03/23  1707     History  Chief Complaint  Patient presents with   Emesis   Nausea    Karen Holden is a 32 y.o. female.  32 year old female with a history of marijuana use, anxiety, and depression who presents to the emergency department with nausea vomiting and diarrhea.  Patient reports that she was started on Augmentin for sinus infection.  Took her first dose today and afterwards had nausea vomiting and diarrhea.  Reports that she also ate a hot pocket around that time.  No significant abdominal pain.  No fevers.  Does smoke marijuana frequently.  No significant alcohol use.  No abdominal surgeries.       Home Medications Prior to Admission medications   Medication Sig Start Date End Date Taking? Authorizing Provider  doxycycline (VIBRAMYCIN) 100 MG capsule Take 1 capsule (100 mg total) by mouth 2 (two) times daily for 7 days. 03/03/23 03/10/23 Yes Fransico Meadow, MD  metoCLOPramide (REGLAN) 10 MG tablet Take 1 tablet (10 mg total) by mouth every 6 (six) hours. 03/03/23  Yes Fransico Meadow, MD  cetirizine (ZYRTEC) 10 MG tablet Take 10 mg by mouth daily. Patient not taking: Reported on 03/03/2023    [provider]  flecainide (TAMBOCOR) 50 MG tablet Take 50 mg by mouth 2 (two) times daily. Patient not taking: Reported on 02/03/2023 06/03/22   [provider]  ibuprofen (ADVIL) 800 MG tablet Take 800 mg by mouth every 8 (eight) hours as needed.    [provider]  levocetirizine (XYZAL) 5 MG tablet Take 5 mg by mouth every evening.    [provider]  metoprolol succinate (TOPROL-XL) 25 MG 24 hr tablet Take 25 mg by mouth daily. 05/31/22   [provider]  pimecrolimus (ELIDEL) 1 % cream Apply to affected areas legs and underarms twice daily until improved. Patient not taking: Reported on 02/03/2023 06/11/22   Brendolyn Patty, MD  venlafaxine XR (EFFEXOR-XR) 37.5 MG 24 hr capsule Take 1 capsule (37.5 mg total) by mouth daily with breakfast. Patient not taking: Reported on 03/03/2023 02/16/23   Evern Bio, NP      Allergies    Bee pollen, Buspar [buspirone], Celebrex [celecoxib], Imitrex [sumatriptan], and Sertraline    Review of Systems   Review of Systems  Physical Exam Updated Vital Signs BP 102/69   Pulse (!) 106   Temp (!) 97.5 F (36.4 C) (Oral)   Resp 16 Comment: Simultaneous filing. User may not have seen previous data.  Ht 5\' 5"  (1.651 m)   Wt 51.7 kg   LMP 02/27/2023 (Exact Date)   SpO2 100%   Breastfeeding Unknown   BMI 18.97 kg/m  Physical Exam Vitals and nursing note reviewed.  Constitutional:      General: She is not in acute distress.    Appearance: She is well-developed.     Comments: Vomiting occasionally while I was in the room  HENT:     Head: Normocephalic and atraumatic.     Right Ear: External ear normal.     Left Ear: External ear normal.     Nose: Nose normal.  Eyes:     Extraocular Movements: Extraocular movements intact.     Conjunctiva/sclera: Conjunctivae normal.     Pupils: Pupils are equal, round, and reactive to light.  Pulmonary:     Effort: Pulmonary effort is normal. No respiratory  distress.  Abdominal:     General: Abdomen is flat. There is no distension.     Palpations: Abdomen is soft. There is no mass.     Tenderness: There is no abdominal tenderness. There is no guarding.  Musculoskeletal:     Cervical back: Normal range of motion and neck supple.  Skin:    General: Skin is warm and dry.  Neurological:     Mental Status: She is alert and oriented to person, place, and time. Mental status is at baseline.  Psychiatric:        Mood and Affect: Mood normal.     ED Results / Procedures / Treatments   Labs (all labs ordered are listed, but only abnormal results are displayed) Labs Reviewed  COMPREHENSIVE METABOLIC PANEL - Abnormal;  Notable for the following components:      Result Value   Potassium 3.2 (*)    Glucose, Bld 102 (*)    Alkaline Phosphatase 26 (*)    All other components within normal limits  CBC WITH DIFFERENTIAL/PLATELET - Abnormal; Notable for the following components:   WBC 12.9 (*)    Neutro Abs 12.0 (*)    Lymphs Abs 0.3 (*)    All other components within normal limits  URINALYSIS, ROUTINE W REFLEX MICROSCOPIC - Abnormal; Notable for the following components:   APPearance HAZY (*)    Hgb urine dipstick LARGE (*)    Ketones, ur 80 (*)    Protein, ur 100 (*)    All other components within normal limits  LIPASE, BLOOD  I-STAT BETA HCG BLOOD, ED ([facility], [facility], [facility] ONLY)    EKG None  Radiology No results found.  Procedures Procedures   Medications Ordered in ED Medications  ondansetron (ZOFRAN) injection 4 mg (4 mg Intravenous Given 03/03/23 1751)  lactated ringers bolus 1,000 mL (0 mLs Intravenous Stopped 03/03/23 2020)  droperidol (INAPSINE) 2.5 MG/ML injection 1.25 mg (1.25 mg Intravenous Given 03/03/23 1917)  diphenhydrAMINE (BENADRYL) injection 12.5 mg (12.5 mg Intravenous Given 03/03/23 1917)    ED Course/ Medical Decision Making/ A&P                            Medical Decision Making Amount and/or Complexity of Data Reviewed Labs: ordered.  Risk Prescription drug management.   Karen Holden is a 32 y.o. female with comorbidities that complicate the patient evaluation including marijuana use, anxiety, and depression who presents emergency department with nausea, vomiting, diarrhea in the setting of amoxicillin use  Initial Ddx:  Medication side effect, gastroenteritis, cannabinoid hyperemesis, appendicitis, cholecystitis  MDM:  Feel the patient may have gastroenteritis.  Could potentially be a medication side effect of her Augmentin since it is known to cause GI side effects.  Does smoke marijuana frequently so could be related to cannabinoid hyperemesis as well.  No significant  focal tenderness to palpation in the right lower quadrant or right upper quadrant that would suggest appendicitis or cholecystitis.  Not report any abdominal pain at rest either.  Plan:  Labs IV fluids Zofran  ED Summary/Re-evaluation:  Patient underwent the above workup which was reassuring.  Was feeling better after the fluids but was still having nausea and vomiting.  Was given droperidol.  After droperidol she stated that she felt totally back to normal and was not having any more nausea or vomiting.  Was able to tolerate food in the emergency department and was discharged home with prescription of doxycycline instead  of her Augmentin.  Was also given a prescription of Reglan for nausea and vomiting.  Instructed to stop using marijuana in case it was related to her symptoms as well.  Will have her follow-up with her primary doctor in 2 to 3 days.  This patient presents to the ED for concern of complaints listed in HPI, this involves an extensive number of treatment options, and is a complaint that carries with it a high risk of complications and morbidity. Disposition including potential need for admission considered.   Dispo: DC Home. Return precautions discussed including, but not limited to, those listed in the AVS. Allowed pt time to ask questions which were answered fully prior to dc.  Additional history obtained from spouse Records reviewed Outpatient Clinic Notes The following labs were independently interpreted: Chemistry and show no acute abnormality I personally reviewed and interpreted cardiac monitoring: normal sinus rhythm  I personally reviewed and interpreted the pt's EKG: see above for interpretation  I have reviewed the patients home medications and made adjustments as needed Social Determinants of health:  Marijuana use  Final Clinical Impression(s) / ED Diagnoses Final diagnoses:  Nausea vomiting and diarrhea    Rx / DC Orders ED Discharge Orders           Ordered    doxycycline (VIBRAMYCIN) 100 MG capsule  2 times daily        03/03/23 2217    metoCLOPramide (REGLAN) 10 MG tablet  Every 6 hours        03/03/23 2217              Fransico Meadow, MD 03/03/23 2321

## 2023-03-03 NOTE — Discharge Instructions (Addendum)
You were seen for your nausea and vomiting and diarrhea in the emergency department.   At home, please refrain from smoking marijuana and take the Reglan for any nausea or vomiting that may have.  Since the Augmentin may be related to your symptoms please start taking the doxycycline we have prescribed you for your sinus infection.    Follow-up with your primary doctor in 2-3 days regarding your visit.    Return immediately to the emergency department if you experience any of the following: Severe abdominal pain, fevers, or any other concerning symptoms.    Thank you for visiting our Emergency Department. It was a pleasure taking care of you today.

## 2023-03-03 NOTE — ED Triage Notes (Signed)
Pt coming from home via EMS with c/o nausea, vomiting, and diarrhea. Pt states that this started at 2pm today. Pt states that she started Augmentin today for a sinus infection and took a tablet around noon. Pt also states that she ate half a hot pocket shortly before taking the abx. Pt states that she can not think of anything else new or different that caused her symptoms. Pt denies any sick contacts.  556ml NS IV 4 mg zofran IV

## 2023-03-04 ENCOUNTER — Encounter: Payer: Self-pay | Admitting: Nurse Practitioner

## 2023-03-05 ENCOUNTER — Encounter: Payer: Self-pay | Admitting: Nurse Practitioner

## 2023-03-05 ENCOUNTER — Other Ambulatory Visit: Payer: Self-pay | Admitting: Nurse Practitioner

## 2023-03-05 DIAGNOSIS — J069 Acute upper respiratory infection, unspecified: Secondary | ICD-10-CM

## 2023-03-05 MED ORDER — AZITHROMYCIN 250 MG PO TABS: ORAL_TABLET | ORAL | 0 refills | Status: AC

## 2023-03-10 ENCOUNTER — Ambulatory Visit: Payer: Medicaid Other | Admitting: Nurse Practitioner

## 2023-04-30 ENCOUNTER — Encounter: Payer: Self-pay | Admitting: Cardiovascular Disease

## 2023-04-30 ENCOUNTER — Ambulatory Visit: Payer: Medicaid Other | Admitting: Cardiovascular Disease

## 2023-04-30 VITALS — BP 110/70 | HR 65 | Ht 65.0 in | Wt 109.0 lb

## 2023-04-30 DIAGNOSIS — I471 Supraventricular tachycardia, unspecified: Secondary | ICD-10-CM | POA: Insufficient documentation

## 2023-04-30 MED ORDER — METOPROLOL SUCCINATE ER 50 MG PO TB24: 50.0000 mg | ORAL_TABLET | Freq: Every day | ORAL | 4 refills | Status: DC

## 2023-04-30 MED ORDER — FLECAINIDE ACETATE 50 MG PO TABS: 50.0000 mg | ORAL_TABLET | Freq: Two times a day (BID) | ORAL | 4 refills | Status: DC
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
## 2023-04-30 NOTE — Assessment & Plan Note (Signed)
Patient had an episode of fast heart rate and palpitations. Increase metoprolol to 50 mg daily. Patient has not been taking flecainide due to a recent pregnancy. Restart flecainide. Patient understands to notify office immediately if she becomes pregnant again.

## 2023-05-01 ENCOUNTER — Encounter: Payer: Self-pay | Admitting: Nurse Practitioner

## 2023-05-09 ENCOUNTER — Other Ambulatory Visit: Payer: Self-pay | Admitting: Nurse Practitioner

## 2023-05-09 DIAGNOSIS — F419 Anxiety disorder, unspecified: Secondary | ICD-10-CM

## 2023-05-25 ENCOUNTER — Ambulatory Visit: Payer: Medicaid Other | Admitting: Nurse Practitioner

## 2023-05-28 ENCOUNTER — Ambulatory Visit: Payer: Medicaid Other | Admitting: Cardiovascular Disease

## 2023-06-16 ENCOUNTER — Ambulatory Visit: Payer: Medicaid Other | Admitting: Nurse Practitioner

## 2023-06-26 ENCOUNTER — Ambulatory Visit: Payer: Medicaid Other | Admitting: Cardiovascular Disease

## 2023-07-03 ENCOUNTER — Ambulatory Visit: Payer: Medicaid Other | Admitting: Cardiology

## 2023-07-03 ENCOUNTER — Encounter: Payer: Self-pay | Admitting: Cardiology

## 2023-07-03 VITALS — BP 90/70 | HR 64 | Ht 65.0 in | Wt 113.4 lb

## 2023-07-03 DIAGNOSIS — E782 Mixed hyperlipidemia: Secondary | ICD-10-CM | POA: Diagnosis not present

## 2023-07-03 DIAGNOSIS — I471 Supraventricular tachycardia, unspecified: Secondary | ICD-10-CM | POA: Diagnosis not present

## 2023-07-03 NOTE — Assessment & Plan Note (Signed)
Patient doing well on current regimen. Palpitations and fast heart rate well controlled.
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
## 2023-08-15 ENCOUNTER — Other Ambulatory Visit: Payer: Self-pay | Admitting: Cardiovascular Disease

## 2023-08-15 DIAGNOSIS — I471 Supraventricular tachycardia, unspecified: Secondary | ICD-10-CM

## 2023-08-17 ENCOUNTER — Other Ambulatory Visit: Payer: Self-pay | Admitting: Cardiovascular Disease

## 2023-08-17 DIAGNOSIS — I471 Supraventricular tachycardia, unspecified: Secondary | ICD-10-CM

## 2023-09-10 ENCOUNTER — Ambulatory Visit
Admission: EM | Admit: 2023-09-10 | Discharge: 2023-09-10 | Disposition: A | Discharge status: Home / Self Care | Payer: Medicaid Other | Attending: Physician Assistant | Admitting: Physician Assistant

## 2023-09-10 DIAGNOSIS — Z1152 Encounter for screening for COVID-19: Secondary | ICD-10-CM | POA: Diagnosis not present

## 2023-09-10 DIAGNOSIS — O99519 Diseases of the respiratory system complicating pregnancy, unspecified trimester: Secondary | ICD-10-CM | POA: Insufficient documentation

## 2023-09-10 DIAGNOSIS — B9689 Other specified bacterial agents as the cause of diseases classified elsewhere: Secondary | ICD-10-CM | POA: Diagnosis not present

## 2023-09-10 DIAGNOSIS — Z3A00 Weeks of gestation of pregnancy not specified: Secondary | ICD-10-CM | POA: Diagnosis not present

## 2023-09-10 DIAGNOSIS — J019 Acute sinusitis, unspecified: Secondary | ICD-10-CM | POA: Diagnosis not present

## 2023-09-10 MED ORDER — CEPHALEXIN 250 MG PO CAPS: 250.0000 mg | ORAL_CAPSULE | Freq: Four times a day (QID) | ORAL | 0 refills | Status: DC
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
## 2023-09-10 NOTE — ED Triage Notes (Signed)
"  My child got me sick over the weekend, I also found out Monday I am pregnant, I have sinus pressure/pain in my face, started Sunday, No fever".

## 2023-09-11 LAB — SARS CORONAVIRUS 2 (TAT 6-24 HRS): SARS Coronavirus 2: NEGATIVE

## 2023-09-16 ENCOUNTER — Encounter: Payer: Self-pay | Admitting: Cardiology

## 2023-09-16 ENCOUNTER — Ambulatory Visit: Payer: Medicaid Other | Admitting: Cardiology

## 2023-09-16 VITALS — BP 106/68 | HR 97 | Ht 65.0 in | Wt 108.6 lb

## 2023-09-16 DIAGNOSIS — N926 Irregular menstruation, unspecified: Secondary | ICD-10-CM

## 2023-09-16 LAB — POCT URINE PREGNANCY: Preg Test, Ur: POSITIVE — AB
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
## 2023-09-17 ENCOUNTER — Other Ambulatory Visit: Payer: Self-pay | Admitting: Cardiology

## 2023-09-17 DIAGNOSIS — N926 Irregular menstruation, unspecified: Secondary | ICD-10-CM

## 2023-09-17 LAB — HCG, SERUM, QUALITATIVE: hCG,Beta Subunit,Qual,Serum: POSITIVE m[IU]/mL — AB (ref <6)

## 2023-09-18 LAB — BETA HCG QUANT (REF LAB): hCG Quant: 4064 m[IU]/mL

## 2023-09-18 LAB — SPECIMEN STATUS REPORT

## 2023-09-18 NOTE — Progress Notes (Signed)
Patient notified

## 2023-11-02 ENCOUNTER — Ambulatory Visit: Payer: Medicaid Other | Admitting: Cardiovascular Disease

## 2023-11-08 ENCOUNTER — Emergency Department (HOSPITAL_COMMUNITY)
Admission: EM | Admit: 2023-11-08 | Discharge: 2023-11-08 | Disposition: A | Discharge status: Home / Self Care | Payer: No Typology Code available for payment source | Attending: Emergency Medicine | Admitting: Emergency Medicine

## 2023-11-08 ENCOUNTER — Emergency Department (HOSPITAL_COMMUNITY): Payer: No Typology Code available for payment source

## 2023-11-08 ENCOUNTER — Other Ambulatory Visit: Payer: Self-pay

## 2023-11-08 ENCOUNTER — Encounter (HOSPITAL_COMMUNITY): Payer: Self-pay | Admitting: *Deleted

## 2023-11-08 DIAGNOSIS — R072 Precordial pain: Secondary | ICD-10-CM | POA: Insufficient documentation

## 2023-11-08 DIAGNOSIS — O9A211 Injury, poisoning and certain other consequences of external causes complicating pregnancy, first trimester: Secondary | ICD-10-CM | POA: Diagnosis present

## 2023-11-08 DIAGNOSIS — Y92410 Unspecified street and highway as the place of occurrence of the external cause: Secondary | ICD-10-CM | POA: Diagnosis not present

## 2023-11-08 DIAGNOSIS — R0789 Other chest pain: Secondary | ICD-10-CM

## 2023-11-08 DIAGNOSIS — M25531 Pain in right wrist: Secondary | ICD-10-CM | POA: Insufficient documentation

## 2023-11-08 MED ORDER — ACETAMINOPHEN 325 MG PO TABS
650.0000 mg | ORAL_TABLET | Freq: Once | ORAL | Status: AC
  Administered 2023-11-08: 650 mg via ORAL
  Filled 2023-11-08: qty 2
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
## 2023-11-08 NOTE — Discharge Instructions (Signed)
Please follow-up with your primary care provider in regards recent ER visit.  Today your labs and imaging show they most likely have bruises and they may take Tylenol every 6 hours as needed for pain and use ice.  Please rest the next 2 days and follow-up with your primary care provider.  If symptoms change or worsen please return to the ER.
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
## 2023-11-11 ENCOUNTER — Ambulatory Visit: Payer: Medicaid Other | Admitting: Cardiology

## 2023-11-11 ENCOUNTER — Encounter: Payer: Self-pay | Admitting: Cardiology

## 2023-11-11 VITALS — BP 104/74 | HR 112 | Ht 65.0 in | Wt 108.2 lb

## 2023-11-11 DIAGNOSIS — I471 Supraventricular tachycardia, unspecified: Secondary | ICD-10-CM

## 2023-11-11 MED ORDER — LABETALOL HCL 100 MG PO TABS: 100.0000 mg | ORAL_TABLET | Freq: Two times a day (BID) | ORAL | 1 refills | Status: AC

## 2023-11-11 NOTE — Assessment & Plan Note (Signed)
Patient no longer taking metoprolol or flecainide due to pregnancy. Patient reports occasional palpitations and racing heart. HR elevated today. Will start her on labetalol.
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
## 2023-11-25 ENCOUNTER — Ambulatory Visit: Payer: Medicaid Other | Admitting: Cardiology

## 2023-11-25 ENCOUNTER — Encounter: Payer: Self-pay | Admitting: Cardiology

## 2023-11-25 VITALS — BP 102/62 | HR 101 | Ht 65.0 in | Wt 109.8 lb

## 2023-11-25 DIAGNOSIS — I471 Supraventricular tachycardia, unspecified: Secondary | ICD-10-CM

## 2023-11-25 DIAGNOSIS — Z0130 Encounter for examination of blood pressure without abnormal findings: Secondary | ICD-10-CM
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
## 2023-11-25 NOTE — Assessment & Plan Note (Signed)
Patient no longer taking metoprolol or flecainide due to pregnancy. Patient reports occasional palpitations and racing heart. HR elevated today. Did not start labetalol as prescribed at previous visit. Take labetalol as needed.

## 2023-12-04 ENCOUNTER — Other Ambulatory Visit: Payer: Self-pay | Admitting: Cardiology

## 2023-12-31 ENCOUNTER — Ambulatory Visit
Admission: EM | Admit: 2023-12-31 | Discharge: 2023-12-31 | Disposition: A | Discharge status: Home / Self Care | Payer: Medicaid Other

## 2023-12-31 DIAGNOSIS — U071 COVID-19: Secondary | ICD-10-CM | POA: Diagnosis not present

## 2023-12-31 DIAGNOSIS — Z3490 Encounter for supervision of normal pregnancy, unspecified, unspecified trimester: Secondary | ICD-10-CM

## 2023-12-31 LAB — POC COVID19/FLU A&B COMBO
Covid Antigen, POC: POSITIVE — AB
Influenza A Antigen, POC: NEGATIVE
Influenza B Antigen, POC: NEGATIVE

## 2023-12-31 NOTE — ED Triage Notes (Signed)
"  My throat started hurting Tuesday, Son recently had a stomach bug but seem's to be better now". Vomiting "last night for about an hour". Head head hurts extending down back. @ home COVID19 test at home Negative. No flu vaccine.
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
## 2024-01-26 ENCOUNTER — Encounter: Payer: Self-pay | Admitting: Cardiology

## 2024-01-27 ENCOUNTER — Ambulatory Visit: Payer: Medicaid Other | Admitting: Cardiology
